# Patient Record
Sex: Male | Born: 1965
Health system: Southern US, Community
[De-identification: ages and names within clinical notes are randomized; demographics above are authoritative.]

## PROBLEM LIST (undated history)

## (undated) DIAGNOSIS — H5440 Blindness, one eye, unspecified eye: Secondary | ICD-10-CM

## (undated) DIAGNOSIS — I471 Supraventricular tachycardia: Secondary | ICD-10-CM

## (undated) DIAGNOSIS — I4891 Unspecified atrial fibrillation: Secondary | ICD-10-CM

## (undated) HISTORY — PX: EYE SURGERY: SHX253

## (undated) HISTORY — PX: RETINAL DETACHMENT SURGERY: SHX105

---
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
## 1999-08-06 ENCOUNTER — Encounter: Payer: Self-pay | Admitting: Emergency Medicine

## 1999-08-06 ENCOUNTER — Emergency Department (HOSPITAL_COMMUNITY): Admission: EM | Admit: 1999-08-06 | Discharge: 1999-08-06 | Payer: Self-pay | Admitting: Emergency Medicine

## 1999-08-09 ENCOUNTER — Encounter: Admission: RE | Admit: 1999-08-09 | Discharge: 1999-08-09 | Payer: Self-pay | Admitting: Family Medicine

## 1999-08-18 ENCOUNTER — Encounter: Payer: Self-pay | Admitting: Emergency Medicine

## 1999-08-18 ENCOUNTER — Emergency Department (HOSPITAL_COMMUNITY): Admission: EM | Admit: 1999-08-18 | Discharge: 1999-08-18 | Payer: Self-pay | Admitting: Emergency Medicine

## 1999-09-05 ENCOUNTER — Emergency Department (HOSPITAL_COMMUNITY): Admission: EM | Admit: 1999-09-05 | Discharge: 1999-09-05 | Payer: Self-pay | Admitting: Emergency Medicine

## 1999-09-11 ENCOUNTER — Encounter: Admission: RE | Admit: 1999-09-11 | Discharge: 1999-09-11 | Payer: Self-pay | Admitting: Family Medicine

## 1999-10-18 ENCOUNTER — Encounter: Admission: RE | Admit: 1999-10-18 | Discharge: 1999-10-18 | Payer: Self-pay | Admitting: Family Medicine

## 1999-11-01 ENCOUNTER — Encounter: Admission: RE | Admit: 1999-11-01 | Discharge: 1999-11-01 | Payer: Self-pay | Admitting: Family Medicine

## 1999-11-05 ENCOUNTER — Encounter: Admission: RE | Admit: 1999-11-05 | Discharge: 1999-11-05 | Payer: Self-pay | Admitting: Family Medicine

## 1999-11-08 ENCOUNTER — Ambulatory Visit (HOSPITAL_COMMUNITY): Admission: RE | Admit: 1999-11-08 | Discharge: 1999-11-08 | Payer: Self-pay | Admitting: Sports Medicine

## 1999-11-08 ENCOUNTER — Encounter: Payer: Self-pay | Admitting: Sports Medicine

## 1999-11-12 ENCOUNTER — Encounter: Admission: RE | Admit: 1999-11-12 | Discharge: 1999-11-12 | Payer: Self-pay | Admitting: Family Medicine

## 1999-11-13 ENCOUNTER — Encounter: Admission: RE | Admit: 1999-11-13 | Discharge: 1999-11-13 | Payer: Self-pay | Admitting: Family Medicine

## 1999-11-17 ENCOUNTER — Emergency Department (HOSPITAL_COMMUNITY): Admission: EM | Admit: 1999-11-17 | Discharge: 1999-11-17 | Payer: Self-pay | Admitting: Emergency Medicine

## 1999-11-21 ENCOUNTER — Encounter: Payer: Self-pay | Admitting: Neurology

## 1999-11-21 ENCOUNTER — Ambulatory Visit (HOSPITAL_COMMUNITY): Admission: RE | Admit: 1999-11-21 | Discharge: 1999-11-21 | Payer: Self-pay | Admitting: Neurology

## 1999-12-12 ENCOUNTER — Ambulatory Visit (HOSPITAL_COMMUNITY): Admission: RE | Admit: 1999-12-12 | Discharge: 1999-12-12 | Payer: Self-pay | Admitting: Neurology

## 2000-01-20 ENCOUNTER — Emergency Department (HOSPITAL_COMMUNITY): Admission: EM | Admit: 2000-01-20 | Discharge: 2000-01-20 | Payer: Self-pay | Admitting: *Deleted

## 2000-02-21 ENCOUNTER — Encounter: Admission: RE | Admit: 2000-02-21 | Discharge: 2000-02-21 | Payer: Self-pay | Admitting: Family Medicine

## 2000-03-05 ENCOUNTER — Encounter: Admission: RE | Admit: 2000-03-05 | Discharge: 2000-03-05 | Payer: Self-pay | Admitting: Family Medicine

## 2000-04-08 ENCOUNTER — Encounter: Admission: RE | Admit: 2000-04-08 | Discharge: 2000-04-08 | Payer: Self-pay | Admitting: Sports Medicine

## 2000-04-13 ENCOUNTER — Emergency Department (HOSPITAL_COMMUNITY): Admission: EM | Admit: 2000-04-13 | Discharge: 2000-04-13 | Payer: Self-pay | Admitting: Emergency Medicine

## 2000-06-17 ENCOUNTER — Emergency Department (HOSPITAL_COMMUNITY): Admission: EM | Admit: 2000-06-17 | Discharge: 2000-06-17 | Payer: Self-pay | Admitting: Emergency Medicine

## 2000-06-17 ENCOUNTER — Encounter: Payer: Self-pay | Admitting: Emergency Medicine

## 2000-12-17 ENCOUNTER — Encounter: Admission: RE | Admit: 2000-12-17 | Discharge: 2000-12-17 | Payer: Self-pay | Admitting: Family Medicine

## 2001-01-31 ENCOUNTER — Emergency Department (HOSPITAL_COMMUNITY): Admission: EM | Admit: 2001-01-31 | Discharge: 2001-01-31 | Payer: Self-pay | Admitting: Emergency Medicine

## 2001-02-02 ENCOUNTER — Encounter: Admission: RE | Admit: 2001-02-02 | Discharge: 2001-02-02 | Payer: Self-pay | Admitting: Family Medicine

## 2001-02-17 ENCOUNTER — Encounter: Admission: RE | Admit: 2001-02-17 | Discharge: 2001-02-17 | Payer: Self-pay | Admitting: Sports Medicine

## 2001-02-18 ENCOUNTER — Encounter: Admission: RE | Admit: 2001-02-18 | Discharge: 2001-02-18 | Payer: Self-pay | Admitting: Family Medicine

## 2001-05-12 ENCOUNTER — Encounter: Admission: RE | Admit: 2001-05-12 | Discharge: 2001-05-12 | Payer: Self-pay | Admitting: Family Medicine

## 2001-09-11 ENCOUNTER — Ambulatory Visit (HOSPITAL_COMMUNITY): Admission: EM | Admit: 2001-09-11 | Discharge: 2001-09-11 | Payer: Self-pay | Admitting: Emergency Medicine

## 2001-09-11 ENCOUNTER — Encounter: Payer: Self-pay | Admitting: Emergency Medicine

## 2001-09-18 ENCOUNTER — Encounter: Admission: RE | Admit: 2001-09-18 | Discharge: 2001-12-17 | Payer: Self-pay | Admitting: Orthopedic Surgery

## 2002-03-12 ENCOUNTER — Emergency Department (HOSPITAL_COMMUNITY): Admission: EM | Admit: 2002-03-12 | Discharge: 2002-03-12 | Payer: Self-pay | Admitting: Emergency Medicine

## 2002-04-02 ENCOUNTER — Ambulatory Visit (HOSPITAL_COMMUNITY): Admission: RE | Admit: 2002-04-02 | Discharge: 2002-04-02 | Payer: Self-pay | Admitting: Family Medicine

## 2002-04-02 ENCOUNTER — Encounter: Admission: RE | Admit: 2002-04-02 | Discharge: 2002-04-02 | Payer: Self-pay | Admitting: Family Medicine

## 2002-04-20 ENCOUNTER — Encounter: Admission: RE | Admit: 2002-04-20 | Discharge: 2002-04-20 | Payer: Self-pay | Admitting: Sports Medicine

## 2002-04-22 ENCOUNTER — Encounter: Admission: RE | Admit: 2002-04-22 | Discharge: 2002-04-22 | Payer: Self-pay | Admitting: Family Medicine

## 2003-08-22 ENCOUNTER — Encounter: Admission: RE | Admit: 2003-08-22 | Discharge: 2003-08-22 | Payer: Self-pay | Admitting: Family Medicine

## 2003-09-06 ENCOUNTER — Encounter: Admission: RE | Admit: 2003-09-06 | Discharge: 2003-09-06 | Payer: Self-pay | Admitting: Family Medicine

## 2004-01-26 ENCOUNTER — Emergency Department (HOSPITAL_COMMUNITY): Admission: EM | Admit: 2004-01-26 | Discharge: 2004-01-27 | Payer: Self-pay | Admitting: Emergency Medicine

## 2006-09-18 ENCOUNTER — Emergency Department (HOSPITAL_COMMUNITY): Admission: EM | Admit: 2006-09-18 | Discharge: 2006-09-18 | Payer: Self-pay | Admitting: Emergency Medicine

## 2006-09-20 ENCOUNTER — Emergency Department (HOSPITAL_COMMUNITY): Admission: EM | Admit: 2006-09-20 | Discharge: 2006-09-20 | Payer: Self-pay | Admitting: Emergency Medicine

## 2006-12-04 ENCOUNTER — Emergency Department (HOSPITAL_COMMUNITY): Admission: EM | Admit: 2006-12-04 | Discharge: 2006-12-05 | Payer: Self-pay | Admitting: Emergency Medicine

## 2006-12-25 DIAGNOSIS — F3289 Other specified depressive episodes: Secondary | ICD-10-CM | POA: Insufficient documentation

## 2006-12-25 DIAGNOSIS — F1420 Cocaine dependence, uncomplicated: Secondary | ICD-10-CM | POA: Insufficient documentation

## 2006-12-25 DIAGNOSIS — F1010 Alcohol abuse, uncomplicated: Secondary | ICD-10-CM | POA: Insufficient documentation

## 2006-12-25 DIAGNOSIS — F329 Major depressive disorder, single episode, unspecified: Secondary | ICD-10-CM

## 2006-12-25 DIAGNOSIS — F17210 Nicotine dependence, cigarettes, uncomplicated: Secondary | ICD-10-CM

## 2007-02-14 ENCOUNTER — Emergency Department (HOSPITAL_COMMUNITY): Admission: EM | Admit: 2007-02-14 | Discharge: 2007-02-14 | Payer: Self-pay | Admitting: Emergency Medicine

## 2007-06-21 ENCOUNTER — Inpatient Hospital Stay (HOSPITAL_COMMUNITY): Admission: EM | Admit: 2007-06-21 | Discharge: 2007-06-24 | Payer: Self-pay | Admitting: *Deleted

## 2007-06-25 ENCOUNTER — Ambulatory Visit: Payer: Self-pay | Admitting: *Deleted

## 2008-03-25 ENCOUNTER — Emergency Department (HOSPITAL_COMMUNITY): Admission: EM | Admit: 2008-03-25 | Discharge: 2008-03-25 | Payer: Self-pay | Admitting: Emergency Medicine

## 2008-05-21 ENCOUNTER — Emergency Department (HOSPITAL_COMMUNITY): Admission: EM | Admit: 2008-05-21 | Discharge: 2008-05-21 | Payer: Self-pay | Admitting: Emergency Medicine

## 2009-05-25 ENCOUNTER — Emergency Department (HOSPITAL_COMMUNITY): Admission: EM | Admit: 2009-05-25 | Discharge: 2009-05-26 | Payer: Self-pay | Admitting: Pediatrics

## 2009-08-07 ENCOUNTER — Emergency Department (HOSPITAL_COMMUNITY): Admission: EM | Admit: 2009-08-07 | Discharge: 2009-08-07 | Payer: Self-pay | Admitting: Family Medicine

## 2009-08-13 ENCOUNTER — Emergency Department (HOSPITAL_COMMUNITY): Admission: EM | Admit: 2009-08-13 | Discharge: 2009-08-13 | Payer: Self-pay | Admitting: Emergency Medicine

## 2010-06-23 ENCOUNTER — Emergency Department (HOSPITAL_COMMUNITY): Admission: EM | Admit: 2010-06-23 | Discharge: 2010-06-23 | Payer: Self-pay | Admitting: Emergency Medicine

## 2010-07-17 ENCOUNTER — Emergency Department (HOSPITAL_COMMUNITY): Admission: EM | Admit: 2010-07-17 | Discharge: 2010-07-17 | Payer: Self-pay | Admitting: Emergency Medicine

## 2010-08-12 ENCOUNTER — Emergency Department (HOSPITAL_COMMUNITY): Admission: EM | Admit: 2010-08-12 | Discharge: 2010-08-12 | Payer: Self-pay | Admitting: Emergency Medicine

## 2010-08-14 ENCOUNTER — Emergency Department (HOSPITAL_COMMUNITY): Admission: EM | Admit: 2010-08-14 | Discharge: 2010-08-14 | Payer: Self-pay | Admitting: Emergency Medicine

## 2010-08-16 ENCOUNTER — Emergency Department (HOSPITAL_COMMUNITY): Admission: EM | Admit: 2010-08-16 | Discharge: 2010-08-16 | Payer: Self-pay | Admitting: Emergency Medicine

## 2010-11-09 ENCOUNTER — Emergency Department (HOSPITAL_COMMUNITY)
Admission: EM | Admit: 2010-11-09 | Discharge: 2010-11-09 | Disposition: A | Discharge status: Other Acute Inpatient Hospital | Payer: Self-pay | Source: Home / Self Care | Admitting: Family Medicine

## 2010-11-09 ENCOUNTER — Emergency Department (HOSPITAL_COMMUNITY)
Admission: EM | Admit: 2010-11-09 | Discharge: 2010-11-09 | Payer: Self-pay | Source: Home / Self Care | Admitting: Emergency Medicine

## 2010-11-27 ENCOUNTER — Emergency Department (HOSPITAL_COMMUNITY)
Admission: EM | Admit: 2010-11-27 | Discharge: 2010-11-27 | Payer: Self-pay | Source: Home / Self Care | Admitting: Emergency Medicine

## 2010-12-12 ENCOUNTER — Other Ambulatory Visit: Payer: Self-pay | Admitting: Family Medicine

## 2010-12-12 ENCOUNTER — Ambulatory Visit
Admission: RE | Admit: 2010-12-12 | Discharge: 2010-12-12 | Disposition: A | Discharge status: Home / Self Care | Payer: BC Managed Care – PPO | Source: Ambulatory Visit | Attending: Family Medicine | Admitting: Family Medicine

## 2010-12-12 DIAGNOSIS — H539 Unspecified visual disturbance: Secondary | ICD-10-CM

## 2010-12-12 MED ORDER — GADOBENATE DIMEGLUMINE 529 MG/ML IV SOLN: 18.0000 mL | Freq: Once | INTRAVENOUS | Status: AC | PRN

## 2011-01-08 ENCOUNTER — Emergency Department (HOSPITAL_COMMUNITY)
Admission: EM | Admit: 2011-01-08 | Discharge: 2011-01-08 | Disposition: A | Discharge status: Home / Self Care | Payer: BC Managed Care – PPO | Attending: Emergency Medicine | Admitting: Emergency Medicine

## 2011-01-08 DIAGNOSIS — G40909 Epilepsy, unspecified, not intractable, without status epilepticus: Secondary | ICD-10-CM | POA: Insufficient documentation

## 2011-01-08 DIAGNOSIS — Z79899 Other long term (current) drug therapy: Secondary | ICD-10-CM | POA: Insufficient documentation

## 2011-01-08 DIAGNOSIS — L02419 Cutaneous abscess of limb, unspecified: Secondary | ICD-10-CM | POA: Insufficient documentation

## 2011-01-08 DIAGNOSIS — L738 Other specified follicular disorders: Secondary | ICD-10-CM | POA: Insufficient documentation

## 2011-01-09 LAB — CULTURE, ROUTINE-ABSCESS

## 2011-01-31 LAB — CBC
MCHC: 33.5 g/dL (ref 30.0–36.0)
MCV: 83.2 fL (ref 78.0–100.0)
RBC: 5.33 MIL/uL (ref 4.22–5.81)
RDW: 14.2 % (ref 11.5–15.5)

## 2011-01-31 LAB — BASIC METABOLIC PANEL
BUN: 11 mg/dL (ref 6–23)
CO2: 28 mEq/L (ref 19–32)
Chloride: 100 mEq/L (ref 96–112)
Creatinine, Ser: 1 mg/dL (ref 0.4–1.5)
GFR calc Af Amer: >60 mL/min (ref >60)
Glucose, Bld: 110 mg/dL — ABNORMAL HIGH (ref 70–99)

## 2011-01-31 LAB — DIFFERENTIAL
Basophils Relative: 0 % (ref 0–1)
Eosinophils Absolute: 0.2 10*3/uL (ref 0.0–0.7)
Monocytes Relative: 11 % (ref 3–12)
Neutrophils Relative %: 56 % (ref 43–77)
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
## 2011-03-15 NOTE — Op Note (Signed)
[facility]  Patient:    MELTON, WALLS Visit Number: [id number] MRN: [id number]          Service Type: EMS Location: ED Attending Physician:  Tobey Bride Dictated by:   Katy Fitch Naaman Plummer., M.D. Proc. Date: 09/11/01 Admit Date:  09/11/2001                             Operative Report  PREOPERATIVE DIAGNOSIS:  Status post accidental chain saw laceration, dorsal radial aspect of left hand extending from the metacarpophalangeal joint across the first dorsal interosseous and first web space over the dorsal aspect of the index metacarpal, with obvious open fracture/excavation injury of index metacarpal and untidy laceration of first dorsal interosseous muscle and ulnar deviation of the posterior finger.  POSTOPERATIVE DIAGNOSIS:  Status post accidental chain saw laceration, dorsal radial aspect of left hand extending from the metacarpophalangeal joint across the first dorsal interosseous and first web space over the dorsal aspect of the index metacarpal, with obvious open fracture/excavation injury of index metacarpal and untidy laceration of first dorsal interosseous muscle and ulnar deviation of the posterior finger.  Identification of laceration/avulsion of one of the dorsal radial sensory branches to the radial aspect of the index finger and a very untidy injury to the first dorsal interosseous muscle.  OPERATIONS: 1. Irrigation and debridement of open excavation fracture of left index    metacarpal neck region. 2. Repair of lacerated first dorsal interosseous intrinsic muscle, radial    aspect of index finger. 3. Layered closure of 8 cm laceration of left index finger hand, dorsal    aspect.  OPERATING SURGEON:  Katy Fitch. Sypher, Montez Hageman., M.D.  ASSISTANT:  Jonni Sanger, P.A.  ANESTHESIA:  0.25% Marcaine and 1% lidocaine field block of left hand and block of radial sensory branches at the wrist level.  SUPERVISING ANESTHESIOLOGIST:   Lucille Passy, M.D.  INDICATIONS:  The patient is a 45 year old gentleman who is "disabled."  He has a history of depression and is currently taking Zoloft and Remeron.  He was using a chain saw earlier today cutting up branches, using the chain saw in his right hand with only one hand controlling the saw and trying to hold wood with the left hand.  He had a "kick back" injury and sustained a deep laceration to the dorsal aspect of the left hand just proximal to the metacarpophalangeal joint.  He has obvious laceration to the level of the bone with very untidy muscle injury.  He had venous bleeding.  He was brought to the [facility] Emergency Room, where he was evaluated by Dr. Beverely Pace, the attending emergency room physician.  Dr. Beverely Pace dressed his wound, started an IV, gave him Ancef and checked his tetanus status, which was noted to be up to date.  He had had a booster within five years.  A hand surgery consult was requested.  Screening labs were obtained and arrangements were made for transfer to the operating room for irrigation and debridement of his open fracture, followed by repair of his muscle injuries and repair of any nerve and tendon injuries we identified, if possible.  DESCRIPTION OF PROCEDURE:  The patient was brought to the operating room and placed in the supine position on the operating table.  Following light sedation, the left arm was prepped with Betadine soap and solution and sterilely draped.  Lidocaine 1% and 0.25% Marcaine, both with epinephrine, were  infiltrated along the wound margins and across the dorsal radial aspect of the wrist to obtain a block of the radial superficial sensory branches.  When anesthesia was satisfactory, the arm was exsanguinated and an arterial tourniquet on the proximal left brachium inflated to 220 mmHg.  The wound was copiously irrigated with sterile saline followed by meticulous debridement of the periosteum,  muscle, subcutaneous fat and skin margins.  All visible foreign material was removed.  There was both vegetable-type material and what appeared to be some gritty dirt-type material.  The wound was then copiously irrigated with triple-antibiotic solution, followed by closure of the muscle in layers with mattress sutures of 3-0 Vicryl and repair of the tendon with two grasping sutures of 3-0 Vicryl.  This corrected the ulnar deviation deformity of the index MP joint.  The skin was then repaired with layered closure with subdermal sutures of 3-0 Vicryl and intradermal 3-0 Prolene.  The length of the repair was approximately 8 cm.  The wound was then dressed with Xeroform sterile gauze and a volar splint, maintaining the wrist in 20 degrees of dorsiflexion, the index in 39 degrees of MP flexion, IP extension and radial deviation of the MP joint to relax the first dorsal interosseous muscle repair.  The patient was awakened from sedation and transferred to the recovery room with stable vital signs.  We advised him to elevate his hand for the next week and have given him prescriptions for Percocet 5 mg, 1-2 tablets p.o. q.4-6h. p.r.n. pain.  Also, Keflex 500 mg, one p.o. q.8h. x 4 days as a prophylactic antibiotic. Dictated by:   Katy Fitch Naaman Plummer., M.D. Attending Physician:  Tobey Bride DD:  09/11/01 TD:  09/11/01 Job: [id number]
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
## 2011-07-24 LAB — CULTURE, ROUTINE-ABSCESS

## 2011-08-09 LAB — DRUGS OF ABUSE SCREEN W/O ALC, ROUTINE URINE
Amphetamine Screen, Ur: NEGATIVE
Marijuana Metabolite: NEGATIVE
Propoxyphene: NEGATIVE

## 2011-08-09 LAB — COMPREHENSIVE METABOLIC PANEL
ALT: 12
AST: 17
CO2: 27
Calcium: 9.4
Chloride: 100
GFR calc Af Amer: >60
GFR calc non Af Amer: >60
Sodium: 137
Total Bilirubin: 0.8

## 2011-08-09 LAB — URINALYSIS, ROUTINE W REFLEX MICROSCOPIC
Nitrite: NEGATIVE
Specific Gravity, Urine: 1.02
pH: 7.5

## 2011-08-09 LAB — HEPATIC FUNCTION PANEL
ALT: 11
Bilirubin, Direct: 0.1
Indirect Bilirubin: 0.7
Total Bilirubin: 0.8

## 2011-08-09 LAB — DIFFERENTIAL
Eosinophils Absolute: 0.2
Eosinophils Relative: 2
Lymphs Abs: 2.1
Monocytes Absolute: 1 — ABNORMAL HIGH

## 2011-08-09 LAB — TSH: TSH: 2.409

## 2011-08-09 LAB — CBC
RBC: 5.55
WBC: 10.3

## 2013-05-11 ENCOUNTER — Other Ambulatory Visit (HOSPITAL_COMMUNITY): Payer: Self-pay | Admitting: Family Medicine

## 2013-05-11 DIAGNOSIS — R079 Chest pain, unspecified: Secondary | ICD-10-CM

## 2013-05-14 ENCOUNTER — Ambulatory Visit (HOSPITAL_COMMUNITY)
Admission: RE | Admit: 2013-05-14 | Discharge: 2013-05-14 | Disposition: A | Discharge status: Home / Self Care | Payer: Commercial Managed Care - PPO | Source: Ambulatory Visit | Attending: Family Medicine | Admitting: Family Medicine

## 2013-05-14 DIAGNOSIS — R071 Chest pain on breathing: Secondary | ICD-10-CM | POA: Insufficient documentation

## 2013-05-14 DIAGNOSIS — J438 Other emphysema: Secondary | ICD-10-CM | POA: Insufficient documentation

## 2013-05-14 DIAGNOSIS — I2510 Atherosclerotic heart disease of native coronary artery without angina pectoris: Secondary | ICD-10-CM | POA: Insufficient documentation

## 2013-05-14 DIAGNOSIS — R079 Chest pain, unspecified: Secondary | ICD-10-CM

## 2013-05-14 DIAGNOSIS — F17200 Nicotine dependence, unspecified, uncomplicated: Secondary | ICD-10-CM | POA: Insufficient documentation

## 2016-06-03 ENCOUNTER — Encounter (HOSPITAL_COMMUNITY): Payer: Self-pay | Admitting: Emergency Medicine

## 2016-06-03 ENCOUNTER — Ambulatory Visit (HOSPITAL_COMMUNITY)
Admission: EM | Admit: 2016-06-03 | Discharge: 2016-06-03 | Disposition: A | Discharge status: Home / Self Care | Payer: Commercial Managed Care - PPO | Attending: Family Medicine | Admitting: Family Medicine

## 2016-06-03 DIAGNOSIS — T1592XA Foreign body on external eye, part unspecified, left eye, initial encounter: Secondary | ICD-10-CM | POA: Diagnosis not present

## 2016-06-03 HISTORY — DX: Blindness, one eye, unspecified eye: H54.40

## 2016-06-03 MED ORDER — EYE WASH OPHTH SOLN
OPHTHALMIC | Status: AC
  Filled 2016-06-03: qty 118

## 2016-06-03 MED ORDER — FLUORESCEIN SODIUM 1 MG OP STRP
ORAL_STRIP | OPHTHALMIC | Status: AC
  Filled 2016-06-03: qty 2

## 2016-06-03 MED ORDER — TETRACAINE HCL 0.5 % OP SOLN
OPHTHALMIC | Status: AC
  Filled 2016-06-03: qty 2

## 2016-06-03 NOTE — ED Notes (Signed)
Eye box at bedside 
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
## 2016-06-06 ENCOUNTER — Ambulatory Visit (HOSPITAL_COMMUNITY)
Admission: EM | Admit: 2016-06-06 | Discharge: 2016-06-06 | Disposition: A | Discharge status: Home / Self Care | Payer: Commercial Managed Care - PPO | Attending: Family Medicine | Admitting: Family Medicine

## 2016-06-06 ENCOUNTER — Encounter (HOSPITAL_COMMUNITY): Payer: Self-pay | Admitting: Emergency Medicine

## 2016-06-06 DIAGNOSIS — H578 Other specified disorders of eye and adnexa: Secondary | ICD-10-CM | POA: Diagnosis not present

## 2016-06-06 DIAGNOSIS — H109 Unspecified conjunctivitis: Secondary | ICD-10-CM

## 2016-06-06 DIAGNOSIS — H5789 Other specified disorders of eye and adnexa: Secondary | ICD-10-CM

## 2016-06-06 MED ORDER — KETOTIFEN FUMARATE 0.025 % OP SOLN: 1.0000 [drp] | Freq: Two times a day (BID) | OPHTHALMIC | 0 refills | Status: DC

## 2016-06-06 MED ORDER — POLYMYXIN B-TRIMETHOPRIM 10000-0.1 UNIT/ML-% OP SOLN: 1.0000 [drp] | OPHTHALMIC | 0 refills | Status: DC

## 2016-06-06 NOTE — ED Provider Notes (Signed)
CSN: [id number]     Arrival date & time 06/06/16  1009 History   First MD Initiated Contact with Patient 06/06/16 1107     Chief Complaint  Patient presents with  . Eye Pain   (Consider location/radiation/quality/duration/timing/severity/associated sxs/prior Treatment) 50 year old male presents to the urgent care with pain and swelling and redness and irritation to the left eye. He states he was seen in the urgent care proximal and one week ago and received a liter of normal saline for irrigation of the left eye via Morgan lens. The initial onset of symptoms occurred while he was working on a job in which there were fine particular matter in the environment and believes this injured his eye. He started having some eye redness and irritation and watering which prompted his visit to the urgent care. He states he received no additional medications are eyedrops. He has been relatively comfortable and did feel much better after the eye irrigation. In the past couple of days he has noticed an increase in erythema, some exudate and matting of fluid around the eyelids, irritation and discomfort.  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      Past Medical History:  Diagnosis Date  . 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 Detached retina    History reviewed. No pertinent surgical history. History reviewed. No pertinent family history. Social History  Substance Use Topics  . Smoking status: Current Every Day Smoker    Packs/day: 1.50    Types: Cigarettes  . Smokeless tobacco: Current User  . Alcohol use No    Review of Systems  Constitutional: Negative.   HENT: Negative.   Eyes: Positive for pain, discharge, redness and itching.  Respiratory: Negative.   Skin: Negative.   Neurological: Negative.   All other systems reviewed and are negative.   Allergies  Review of patient's allergies  indicates no known allergies.  Home Medications   Prior to Admission medications   Medication Sig Start Date End Date Taking? Authorizing Provider  aspirin 81 MG tablet Take 81 mg by mouth daily.    Historical Provider, MD  ketotifen (ZADITOR) 0.025 % ophthalmic solution Place 1 drop into the left eye 2 (two) times daily. 06/06/16   Hayden Rasmussenavid Reilley Latorre, NP  traMADol (ULTRAM) 50 MG tablet Take by mouth every 6 (six) hours as needed.    Historical Provider, MD  trimethoprim-polymyxin b (POLYTRIM) ophthalmic solution Place 1 drop into the left eye every 4 (four) hours. 06/06/16   Hayden Rasmussenavid Analilia Geddis, NP   Meds Ordered and Administered this Visit  Medications - No data to display  BP 138/84 (BP Location: Left Arm)   Pulse 69   Temp 98 F (36.7 C) (Oral)   Resp 16   SpO2 97%  No data found.   Physical Exam  Constitutional: He is oriented to person, place, and time. He appears well-developed and well-nourished. No distress.  HENT:  Head: Normocephalic and atraumatic.  Eyes:  Left eye with normal EOM. The sclera is moderately injected and erythematous. The upper and lower conjunctiva with erythema and swelling. Small amount of clear watery drainage. No exudate or purulent material seen. The anterior chamber and iris has been destroyed from previous injury. No pupillary response.  Neck: Normal range of motion. Neck supple.  Cardiovascular: Normal rate.   Pulmonary/Chest: Effort normal.  Musculoskeletal: Normal range of motion.  Neurological: He is alert and oriented to person, place, and time.  Skin: Skin is warm and dry.  Psychiatric: He has a normal mood and affect.  Nursing note and vitals reviewed.   Urgent Care Course   Clinical Course    Procedures (including critical care time)  Labs Review Labs Reviewed - No data to display  Imaging Review No results found.   Visual Acuity Review  Right Eye Distance: (S) 20/40 w/o corrections Left Eye Distance: (S) hx retinal detachment Bilateral  Distance:    Right Eye Near:   Left Eye Near:    Bilateral Near:         MDM   1. Conjunctivitis of left eye   2. Eye irritation    Meds ordered this encounter  Medications  . trimethoprim-polymyxin b (POLYTRIM) ophthalmic solution    Sig: Place 1 drop into the left eye every 4 (four) hours.    Dispense:  10 mL    Refill:  0    Order Specific Question:   Supervising Provider    Answer:   HONIG, Charm [id number]  . ketotifen (ZADITOR) 0.025 % ophthalmic solution    Sig: Place 1 drop into the left eye 2 (two) times daily.    Dispense:  5 mL    Refill:  0    Order Specific Question:   Supervising Provider    Answer:   Charm Rings [[id number]]  F/U with opthal as directed. P.1     Hayden Rasmussen, NP 06/06/16 1131

## 2016-06-06 NOTE — ED Triage Notes (Signed)
C/o left eye pain/irriation onset x1 week... 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 Hx of retinal detachment at age 536-50 y/o and blindness of that eye.... Sx today include redness, pain and watery.... A&O x4... NAD

## 2016-12-02 ENCOUNTER — Ambulatory Visit (INDEPENDENT_AMBULATORY_CARE_PROVIDER_SITE_OTHER): Payer: Self-pay | Admitting: Family Medicine

## 2016-12-02 DIAGNOSIS — Z0000 Encounter for general adult medical examination without abnormal findings: Secondary | ICD-10-CM

## 2016-12-04 LAB — TB SKIN TEST
Induration: 0 mm
TB Skin Test: NEGATIVE

## 2017-08-12 ENCOUNTER — Emergency Department
Admission: EM | Admit: 2017-08-12 | Discharge: 2017-08-12 | Disposition: A | Discharge status: Home / Self Care | Payer: Commercial Managed Care - PPO | Attending: Emergency Medicine | Admitting: Emergency Medicine

## 2017-08-12 ENCOUNTER — Emergency Department: Payer: Commercial Managed Care - PPO

## 2017-08-12 ENCOUNTER — Encounter: Payer: Self-pay | Admitting: Emergency Medicine

## 2017-08-12 DIAGNOSIS — R42 Dizziness and giddiness: Secondary | ICD-10-CM | POA: Insufficient documentation

## 2017-08-12 DIAGNOSIS — Z5321 Procedure and treatment not carried out due to patient leaving prior to being seen by health care provider: Secondary | ICD-10-CM | POA: Insufficient documentation

## 2017-08-12 LAB — COMPREHENSIVE METABOLIC PANEL
ALBUMIN: 4 g/dL (ref 3.5–5.0)
ALT: 71 U/L — ABNORMAL HIGH (ref 17–63)
ANION GAP: 9 (ref 5–15)
AST: 52 U/L — ABNORMAL HIGH (ref 15–41)
Alkaline Phosphatase: 53 U/L (ref 38–126)
BILIRUBIN TOTAL: 0.5 mg/dL (ref 0.3–1.2)
BUN: 14 mg/dL (ref 6–20)
CO2: 26 mmol/L (ref 22–32)
Calcium: 9.1 mg/dL (ref 8.9–10.3)
Chloride: 102 mmol/L (ref 101–111)
Creatinine, Ser: 1 mg/dL (ref 0.61–1.24)
GFR calc Af Amer: >60 mL/min (ref >60)
GFR calc non Af Amer: >60 mL/min (ref >60)
GLUCOSE: 145 mg/dL — AB (ref 65–99)
POTASSIUM: 4 mmol/L (ref 3.5–5.1)
SODIUM: 137 mmol/L (ref 135–145)
TOTAL PROTEIN: 8 g/dL (ref 6.5–8.1)

## 2017-08-12 LAB — TROPONIN I: Troponin I: <0.03 ng/mL (ref <0.03)

## 2017-08-12 LAB — DIFFERENTIAL
BASOS PCT: 1 %
Basophils Absolute: 0.1 10*3/uL (ref 0–0.1)
EOS ABS: 0.2 10*3/uL (ref 0–0.7)
EOS PCT: 2 %
Lymphocytes Relative: 25 %
Lymphs Abs: 2.2 10*3/uL (ref 1.0–3.6)
Monocytes Absolute: 0.9 10*3/uL (ref 0.2–1.0)
Monocytes Relative: 10 %
NEUTROS PCT: 62 %
Neutro Abs: 5.7 10*3/uL (ref 1.4–6.5)

## 2017-08-12 LAB — CBC
HCT: 46.7 % (ref 40.0–52.0)
Hemoglobin: 15.5 g/dL (ref 13.0–18.0)
MCH: 27.9 pg (ref 26.0–34.0)
MCHC: 33.2 g/dL (ref 32.0–36.0)
MCV: 84.1 fL (ref 80.0–100.0)
PLATELETS: 213 10*3/uL (ref 150–440)
RBC: 5.55 MIL/uL (ref 4.40–5.90)
RDW: 13.9 % (ref 11.5–14.5)
WBC: 9.1 10*3/uL (ref 3.8–10.6)

## 2017-08-12 LAB — PROTIME-INR
INR: 0.94
PROTHROMBIN TIME: 12.5 s (ref 11.4–15.2)

## 2017-08-12 LAB — APTT: aPTT: 28 seconds (ref 24–36)

## 2017-08-12 NOTE — ED Triage Notes (Signed)
Pt to ED c/o dizziness that started this morning at 0630, woke up fine around 0515.  Denies pain or accompanying symptoms.

## 2017-08-13 ENCOUNTER — Telehealth: Payer: Self-pay | Admitting: Emergency Medicine

## 2017-08-13 NOTE — Telephone Encounter (Signed)
Called patient due to lwot to inquire about condition and follow up plans. Pt says he had to leave as he has a sick family member.  Says he feels okay now.  I explained that it would be best for him to inform his pcp about his symptoms and have them review the diagnostic tests done here.  He agrees and will return or seek care if symptoms return.

## 2018-06-02 ENCOUNTER — Observation Stay (HOSPITAL_COMMUNITY)
Admission: EM | Admit: 2018-06-02 | Discharge: 2018-06-04 | Disposition: A | Discharge status: Home / Self Care | Payer: Self-pay | Attending: Internal Medicine | Admitting: Internal Medicine

## 2018-06-02 ENCOUNTER — Ambulatory Visit (INDEPENDENT_AMBULATORY_CARE_PROVIDER_SITE_OTHER): Payer: Self-pay

## 2018-06-02 ENCOUNTER — Encounter (HOSPITAL_COMMUNITY): Payer: Self-pay

## 2018-06-02 ENCOUNTER — Other Ambulatory Visit: Payer: Self-pay

## 2018-06-02 ENCOUNTER — Ambulatory Visit (HOSPITAL_COMMUNITY)
Admission: EM | Admit: 2018-06-02 | Discharge: 2018-06-02 | Disposition: A | Discharge status: Home / Self Care | Payer: Commercial Managed Care - PPO | Attending: Family Medicine | Admitting: Family Medicine

## 2018-06-02 DIAGNOSIS — Z8249 Family history of ischemic heart disease and other diseases of the circulatory system: Secondary | ICD-10-CM | POA: Insufficient documentation

## 2018-06-02 DIAGNOSIS — F17210 Nicotine dependence, cigarettes, uncomplicated: Secondary | ICD-10-CM | POA: Insufficient documentation

## 2018-06-02 DIAGNOSIS — I4719 Other supraventricular tachycardia: Secondary | ICD-10-CM

## 2018-06-02 DIAGNOSIS — K920 Hematemesis: Secondary | ICD-10-CM

## 2018-06-02 DIAGNOSIS — R05 Cough: Secondary | ICD-10-CM | POA: Insufficient documentation

## 2018-06-02 DIAGNOSIS — J209 Acute bronchitis, unspecified: Secondary | ICD-10-CM | POA: Insufficient documentation

## 2018-06-02 DIAGNOSIS — I471 Supraventricular tachycardia: Secondary | ICD-10-CM | POA: Insufficient documentation

## 2018-06-02 DIAGNOSIS — R112 Nausea with vomiting, unspecified: Secondary | ICD-10-CM | POA: Insufficient documentation

## 2018-06-02 DIAGNOSIS — Z9889 Other specified postprocedural states: Secondary | ICD-10-CM | POA: Insufficient documentation

## 2018-06-02 DIAGNOSIS — I4891 Unspecified atrial fibrillation: Principal | ICD-10-CM

## 2018-06-02 DIAGNOSIS — R059 Cough, unspecified: Secondary | ICD-10-CM

## 2018-06-02 DIAGNOSIS — R9431 Abnormal electrocardiogram [ECG] [EKG]: Secondary | ICD-10-CM | POA: Insufficient documentation

## 2018-06-02 DIAGNOSIS — H5462 Unqualified visual loss, left eye, normal vision right eye: Secondary | ICD-10-CM | POA: Insufficient documentation

## 2018-06-02 DIAGNOSIS — R51 Headache: Secondary | ICD-10-CM | POA: Insufficient documentation

## 2018-06-02 DIAGNOSIS — E7219 Other disorders of sulfur-bearing amino-acid metabolism: Secondary | ICD-10-CM | POA: Diagnosis present

## 2018-06-02 DIAGNOSIS — J40 Bronchitis, not specified as acute or chronic: Secondary | ICD-10-CM

## 2018-06-02 DIAGNOSIS — R509 Fever, unspecified: Secondary | ICD-10-CM | POA: Insufficient documentation

## 2018-06-02 DIAGNOSIS — R062 Wheezing: Secondary | ICD-10-CM

## 2018-06-02 HISTORY — DX: Unspecified atrial fibrillation: I48.91

## 2018-06-02 HISTORY — DX: Supraventricular tachycardia: I47.1

## 2018-06-02 HISTORY — DX: Other supraventricular tachycardia: I47.19

## 2018-06-02 LAB — CBC WITH DIFFERENTIAL/PLATELET
Abs Immature Granulocytes: 0.1 10*3/uL (ref 0.0–0.1)
Basophils Absolute: 0.1 10*3/uL (ref 0.0–0.1)
Basophils Relative: 1 %
EOS PCT: 2 %
Eosinophils Absolute: 0.2 10*3/uL (ref 0.0–0.7)
HEMATOCRIT: 51.3 % (ref 39.0–52.0)
Hemoglobin: 16 g/dL (ref 13.0–17.0)
IMMATURE GRANULOCYTES: 1 %
LYMPHS ABS: 2.4 10*3/uL (ref 0.7–4.0)
Lymphocytes Relative: 18 %
MCH: 27.3 pg (ref 26.0–34.0)
MCHC: 31.2 g/dL (ref 30.0–36.0)
MCV: 87.4 fL (ref 78.0–100.0)
MONO ABS: 1.7 10*3/uL — AB (ref 0.1–1.0)
MONOS PCT: 12 %
Neutro Abs: 9 10*3/uL — ABNORMAL HIGH (ref 1.7–7.7)
Neutrophils Relative %: 66 %
Platelets: 186 10*3/uL (ref 150–400)
RBC: 5.87 MIL/uL — ABNORMAL HIGH (ref 4.22–5.81)
RDW: 14.6 % (ref 11.5–15.5)
WBC: 13.4 10*3/uL — ABNORMAL HIGH (ref 4.0–10.5)

## 2018-06-02 LAB — I-STAT TROPONIN, ED
TROPONIN I, POC: 0 ng/mL (ref 0.00–0.08)
Troponin i, poc: 0.01 ng/mL (ref 0.00–0.08)

## 2018-06-02 LAB — BASIC METABOLIC PANEL
Anion gap: 9 (ref 5–15)
BUN: 11 mg/dL (ref 6–20)
CALCIUM: 9.2 mg/dL (ref 8.9–10.3)
CO2: 26 mmol/L (ref 22–32)
Chloride: 103 mmol/L (ref 98–111)
Creatinine, Ser: 0.83 mg/dL (ref 0.61–1.24)
GFR calc Af Amer: >60 mL/min (ref >60)
GFR calc non Af Amer: >60 mL/min (ref >60)
GLUCOSE: 77 mg/dL (ref 70–99)
Potassium: 4.3 mmol/L (ref 3.5–5.1)
Sodium: 138 mmol/L (ref 135–145)

## 2018-06-02 LAB — HEPATIC FUNCTION PANEL
ALK PHOS: 63 U/L (ref 38–126)
ALT: 87 U/L — AB (ref 0–44)
AST: 54 U/L — ABNORMAL HIGH (ref 15–41)
Albumin: 4 g/dL (ref 3.5–5.0)
Bilirubin, Direct: 0.1 mg/dL (ref 0.0–0.2)
Indirect Bilirubin: 0.4 mg/dL (ref 0.3–0.9)
TOTAL PROTEIN: 9 g/dL — AB (ref 6.5–8.1)
Total Bilirubin: 0.5 mg/dL (ref 0.3–1.2)

## 2018-06-02 LAB — LIPASE, BLOOD: LIPASE: 38 U/L (ref 11–51)

## 2018-06-02 LAB — MAGNESIUM: Magnesium: 1.9 mg/dL (ref 1.7–2.4)

## 2018-06-02 MED ORDER — DOXYCYCLINE HYCLATE 100 MG PO TABS
100.0000 mg | ORAL_TABLET | Freq: Once | ORAL | Status: AC
Start: 2018-06-02 — End: 2018-06-02
  Administered 2018-06-02: 100 mg via ORAL
  Filled 2018-06-02: qty 1

## 2018-06-02 MED ORDER — IPRATROPIUM-ALBUTEROL 0.5-2.5 (3) MG/3ML IN SOLN
3.0000 mL | Freq: Once | RESPIRATORY_TRACT | Status: AC
  Administered 2018-06-02: 3 mL via RESPIRATORY_TRACT
  Filled 2018-06-02: qty 3

## 2018-06-02 MED ORDER — DIPHENHYDRAMINE HCL 25 MG PO CAPS
25.0000 mg | ORAL_CAPSULE | Freq: Once | ORAL | Status: AC
  Administered 2018-06-02: 25 mg via ORAL
  Filled 2018-06-02: qty 1

## 2018-06-02 MED ORDER — METOCLOPRAMIDE HCL 5 MG/ML IJ SOLN
10.0000 mg | Freq: Once | INTRAMUSCULAR | Status: AC
  Administered 2018-06-02: 10 mg via INTRAVENOUS
  Filled 2018-06-02: qty 2

## 2018-06-02 MED ORDER — PREDNISONE 20 MG PO TABS
60.0000 mg | ORAL_TABLET | Freq: Once | ORAL | Status: AC
  Administered 2018-06-02: 60 mg via ORAL
  Filled 2018-06-02: qty 3

## 2018-06-02 NOTE — ED Provider Notes (Signed)
[facility]   CSN: [id number] Arrival date & time: 06/02/18  1723  History   Chief Complaint Chief Complaint  Patient presents with  . Cough    HPI [name] is a 52 y.o. male.  HPI Patient is a 52 year old male with history of left eye blindness and a 30+ pack-year smoking history who presents to the emergency department as transfer from urgent care for evaluation of concern for possible new onset of atrial fibrillation as well as cough, generalized body aches, and worsening shortness of breath of the past 2 or 3 days.  Patient reports that he woke yesterday morning and had one episode of vomiting prior to work. Reports that he was feeling ill so stayed at home.  Has had increased cough with productive sputum over the past couple of days.  Has had no fevers but has had chills.  No known tick exposures or travel.  Denies any chest pain though does report that he occasionally has rib pain with coughing.  Has been having shortness of breath over the past several months that is unchanged and states he can typically walk to his garage and back up the hill before he has to take a break.  No leg swelling.  No history of DVT or PE.  No history of malignancy.  States that he does not typically see doctors and does not like taking any medications.  Takes no daily medications or inhalers at home.  Pt also reports headache today with "pressure behind both eyes" and congestion. No numbness/weakness. No thunderclap. Reports feeling like he has the flu. No tick exposures and is rarely outdoors per pt. Uncertain of his last flu shot.Took tylenol and phenylephrine this morning around 10am with no change in symptoms.  Past Medical History:  Diagnosis Date  . 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 Detached retina     Patient Active Problem List   Diagnosis Date Noted  . COCAINE DEPENDENCY NOS 12/25/2006  . ALCOHOL  ABUSE, UNSPECIFIED 12/25/2006  . TOBACCO DEPENDENCE 12/25/2006  . DEPRESSIVE DISORDER, NOS 12/25/2006    Past Surgical History:  Procedure Laterality Date  . HERNIA REPAIR Right 1969        Home Medications    Prior to Admission medications   Not on File    Family History Family History  Problem Relation Age of Onset  . Healthy Father     Social History Social History   Tobacco Use  . Smoking status: Current Every Day Smoker    Packs/day: 1.00    Types: Cigarettes  . Smokeless tobacco: Never Used  Substance Use Topics  . Alcohol use: No  . Drug use: No     Allergies   Patient has no known allergies.   Review of Systems Review of Systems  Constitutional: Positive for chills. Negative for fever.  HENT: Positive for congestion.   Eyes: Negative for photophobia and visual disturbance.  Respiratory: Positive for cough and shortness of breath.   Cardiovascular: Negative for chest pain and leg swelling.  Gastrointestinal: Negative for abdominal pain, diarrhea, nausea and vomiting.  Genitourinary: Negative for dysuria and hematuria.  Musculoskeletal: Positive for myalgias. Negative for back pain and neck pain.  Skin: Negative for rash.  Neurological: Positive for headaches. Negative for weakness and numbness.     Physical Exam Updated Vital Signs BP 113/81   Pulse 67  Temp 98.6 F (37 C) (Oral)   Resp (!) 23   SpO2 97%   Physical Exam  Constitutional: No distress.  HENT:  Head: Normocephalic and atraumatic.  Mouth/Throat: Oropharynx is clear and moist.  Eyes: Conjunctivae are normal. Right eye exhibits no discharge. Left eye exhibits no discharge.  Left corneal opacification. Right eye reactive to light.   Neck: Normal range of motion. No tracheal deviation present.  Cardiovascular: Regular rhythm, normal heart sounds and intact distal pulses.  Pulmonary/Chest: Effort normal. No respiratory distress. He has wheezes (few scattered bilat).  Coarse  cough. Speaking full sentences and stable on RA.   Abdominal: Soft. Bowel sounds are normal. He exhibits no distension. There is no tenderness. There is no rebound and no guarding.  Musculoskeletal: He exhibits no edema or deformity.  Neurological: He is alert.  Alert and oriented x3.  Cranial nerves II-XII intact.  No facial asymmetry.  5/5 grip strength bilaterally.  5/5 bicep flexion and tricep extension bilaterally. 5/5 flexion/extension at knee, hip, and ankles. No discoordination of FNF, heel to shin, and ambulates without ataxia or antalgic gait.    Skin: Capillary refill takes 2 to 3 seconds. No rash noted. He is not diaphoretic.  Psychiatric: He has a normal mood and affect.     ED Treatments / Results  Labs (all labs ordered are listed, but only abnormal results are displayed) Labs Reviewed  HEPATIC FUNCTION PANEL - Abnormal; Notable for the following components:      Result Value   Total Protein 9.0 (*)    AST 54 (*)    ALT 87 (*)    All other components within normal limits  BASIC METABOLIC PANEL  LIPASE, BLOOD  MAGNESIUM  I-STAT TROPONIN, ED  I-STAT TROPONIN, ED    EKG EKG Interpretation  Date/Time:  Tuesday June 02 2018 17:34:05 EDT Ventricular Rate:  138 PR Interval:  146 QRS Duration: 82 QT Interval:  342 QTC Calculation: 518 R Axis:   97 Text Interpretation:  Undetermined rhythm Rightward axis Anterior infarct , age undetermined Abnormal ECG similar to previous today concern for MAT Reconfirmed by Frederick Peers [phone]) on 06/02/2018 5:57:52 PM   Radiology Dg Chest 2 View  Result Date: 06/02/2018 CLINICAL DATA:  Chest discomfort with fatigue and vomiting as well as fever 2 days. EXAM: CHEST - 2 VIEW COMPARISON:  08/13/2009 FINDINGS: Lungs are adequately inflated without focal airspace consolidation or effusion. Cardiomediastinal silhouette and remainder of the exam is unchanged. IMPRESSION: No active cardiopulmonary disease. Electronically Signed   By:  Elberta Fortis M.D.   On: 06/02/2018 17:09    Procedures Procedures (including critical care time)  Medications Ordered in ED Medications  metoCLOPramide (REGLAN) injection 10 mg (10 mg Intravenous Given 06/02/18 2040)  diphenhydrAMINE (BENADRYL) capsule 25 mg (25 mg Oral Given 06/02/18 2040)  ipratropium-albuterol (DUONEB) 0.5-2.5 (3) MG/3ML nebulizer solution 3 mL (3 mLs Nebulization Given 06/02/18 2112)  predniSONE (DELTASONE) tablet 60 mg (60 mg Oral Given 06/02/18 2111)  doxycycline (VIBRA-TABS) tablet 100 mg (100 mg Oral Given 06/02/18 2143)     Initial Impression / Assessment and Plan / ED Course  I have reviewed the triage vital signs and the nursing notes.  Pertinent labs & imaging results that were available during my care of the patient were reviewed by me and considered in my medical decision making (see chart for details).    Patient is a 52 year old male with history of left eye blindness and a 30+ pack-year smoking history  who presents to the emergency department as transfer from urgent care for evaluation of concern for possible new onset of atrial fibrillation as well as cough, generalized body aches, and worsening shortness of breath of the past 2 or 3 days.    ECG reviewed with rate approx 140, appears to have variable p-waves, rhythm most consistent with MAT, right axis dev, several abberrant beats, no ST elevation or depression. When compared to 07/2017 new underlying rhythm. CXR with no acute cardiopulm abnormalities. No focal consolidation to suggest PNA. No PTX. No effusions. Very atypical presentation for ACS. Did have scattered wheezing and increased cough. Emphysematous changes on prior CT chest though doesn't carry formal diagnosis of COPD. Symptomatically presents similar to COPD exacerbation. Prednisone and doxy given in ED. Pt continues to go in and out of MAT with HR 120s and then 80s. No leg swelling, immobilization, recent surgeries, hypoxia, or tachypnea to necessitate  PE workup at this time. Discussed case with cardiology (Dr. Shirlee Latch) regarding intermittent MAT and they recommend diltiazem drip and hospitalist admission as may be secondary to acute respiratory illness. Discussed with medicine team who will admit. Stable with no acute events in ED.   Case and plan of care discussed with Dr. Clarene Duke.  Final Clinical Impressions(s) / ED Diagnoses   Final diagnoses:  Multifocal atrial tachycardia [facility])  Wheezing  Bronchitis    ED Discharge Orders    None       Rigoberto Noel, MD 06/03/18 3329    Clarene Duke Ambrose Finland, MD 06/12/18 [phone]

## 2018-06-02 NOTE — Discharge Instructions (Signed)
Go to emergency room

## 2018-06-02 NOTE — ED Triage Notes (Signed)
Pt presents with sudden onset of generalized body aches, productive cough and shortness of breath since yesterday.  Pt was seen at urgent care and referred here with new onset of a-fib.  Pt reports chest pain only when he coughs.

## 2018-06-02 NOTE — ED Provider Notes (Addendum)
[facility]    CSN: [id number] Arrival date & time: 06/02/18  1544     History   Chief Complaint Chief Complaint  Patient presents with  . Emesis  . Fever    HPI [name] is a 52 y.o. male.   HPI  [name] is here complaining of a respiratory infection.  He states that on Saturday and Sunday he had a bit of headache.  It felt "pressure behind my eyes".  He woke up Monday morning at 5 AM with a bad coughing spell, that ended up with protracted vomiting.  He has been having coughing with intermittent vomiting ever since that time.  Appetite is poor.  He feels achy and sore.  He feels like he has the flu.  Whole body hurts.  He is able to keep down some fluids, he states he had a Dr. Reino KentPepper earlier today. The patient states in general he has no medical problems, takes no medications, takes no supplements.  Eats a regular diet. He is a long-standing smoker and does have some shortness of breath with exertion.  He suspects that he has some COPD.  He has not had an x-ray in many years.  He does not have a PCP or go for regular checkups. He does not know that he has any heart disease, high cholesterol, high blood pressure.  He does have a family history of heart problems but not at a young age.  He is never had any chest pain. He is here with his wife.  She reports that he has had some streaks of blood in his emesis when he has the protracted vomiting. He absolutely denies any alcohol.  He denies any drug use.  He states he has never had a drug or alcohol addiction.  He states that "I will not have that in my house". He denies any past history of ulcers or GI bleeding  Past Medical History:  Diagnosis Date  . 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 Detached retina     Patient Active Problem List   Diagnosis Date Noted  . COCAINE DEPENDENCY NOS 12/25/2006  . ALCOHOL ABUSE, UNSPECIFIED 12/25/2006  . TOBACCO DEPENDENCE 12/25/2006  .  DEPRESSIVE DISORDER, NOS 12/25/2006    Past Surgical History:  Procedure Laterality Date  . HERNIA REPAIR Right 1969       Home Medications    Prior to Admission medications   Not on File    Family History Family History  Problem Relation Age of Onset  . Healthy Father     Social History Social History   Tobacco Use  . Smoking status: Current Every Day Smoker    Packs/day: 1.00    Types: Cigarettes  . Smokeless tobacco: Never Used  Substance Use Topics  . Alcohol use: No  . Drug use: No     Allergies   Patient has no known allergies.   Review of Systems Review of Systems   Physical Exam Triage Vital Signs ED Triage Vitals  Enc Vitals Group     BP 06/02/18 1623 110/78     Pulse Rate 06/02/18 1623 (!) 53     Resp 06/02/18 1623 16     Temp 06/02/18 1623 98.1 F (36.7 C)     Temp src --      SpO2 06/02/18 1623 99 %     Weight 06/02/18 1624 189 lb 3.2 oz (85.8  kg)     Height --      Head Circumference --      Peak Flow --      Pain Score 06/02/18 1624 8     Pain Loc --      Pain Edu? --      Excl. in GC? --    No data found.  Updated Vital Signs BP 110/78   Pulse (!) 53   Temp 98.1 F (36.7 C)   Resp 16   Wt 189 lb 3.2 oz (85.8 kg)   SpO2 99%   BMI 27.15 kg/m      Physical Exam  Constitutional: He appears well-developed and well-nourished. No distress.  HENT:  Head: Normocephalic and atraumatic.  Right Ear: External ear normal.  Left Ear: External ear normal.  Mouth/Throat: Oropharynx is clear and moist.  Eyes:  Clouded cornea, blind left eye.  Right eye is normal  Neck: Normal range of motion. No thyromegaly present.  Cardiovascular: Normal rate.  No murmur heard. Irregular rate and rhythm  Pulmonary/Chest: Effort normal. No respiratory distress. He has wheezes.  Scattered wheeze.  Anterior rhonchi.  Harsh cough  Abdominal: Soft. Bowel sounds are normal. He exhibits no distension.  No tenderness.  No organomegaly    Musculoskeletal: Normal range of motion. He exhibits no edema.  No ankle edema.  Diminished pedal pulses.  Lymphadenopathy:    He has no cervical adenopathy.  Neurological: He is alert.  Skin: Skin is warm and dry.  Psychiatric: He has a normal mood and affect. His behavior is normal.     UC Treatments / Results  Labs (all labs ordered are listed, but only abnormal results are displayed) Labs Reviewed  CBC WITH DIFFERENTIAL/PLATELET    EKG-new onset atrial fibrillation with rapid ventricular response.  Possible old anterior MI. No ST changes.  Radiology Dg Chest 2 View  Result Date: 06/02/2018 CLINICAL DATA:  Chest discomfort with fatigue and vomiting as well as fever 2 days. EXAM: CHEST - 2 VIEW COMPARISON:  08/13/2009 FINDINGS: Lungs are adequately inflated without focal airspace consolidation or effusion. Cardiomediastinal silhouette and remainder of the exam is unchanged. IMPRESSION: No active cardiopulmonary disease. Electronically Signed   By: Elberta Fortis M.D.   On: 06/02/2018 17:09    Procedures Procedures (including critical care time)  Medications Ordered in UC Medications - No data to display  Initial Impression / Assessment and Plan / UC Course  I have reviewed the triage vital signs and the nursing notes.  Pertinent labs & imaging results that were available during my care of the patient were reviewed by me and considered in my medical decision making (see chart for details).     Discussed with patient with his fatigue, cough, and new onset of heart disease he needed to go the emergency room for further evaluation.  Wife is here with patient and agrees to take him.  She feels comfortable transporting him. Final Clinical Impressions(s) / UC Diagnoses   Final diagnoses:  Atrial fibrillation, new onset (HCC)  Cough  Hematemesis, presence of nausea not specified     Discharge Instructions     Go to emergency room    ED Prescriptions    None      Controlled Substance Prescriptions [place] Controlled Substance Registry consulted? Not Applicable   Eustace Moore, MD 06/02/18 Hilma Favors, MD 06/02/18 [phone]

## 2018-06-02 NOTE — ED Triage Notes (Signed)
Pt has a cold and congestion. Pt states he has had fever off and on and been vomiting. 2 days.

## 2018-06-03 ENCOUNTER — Observation Stay (HOSPITAL_COMMUNITY): Payer: Self-pay

## 2018-06-03 ENCOUNTER — Other Ambulatory Visit: Payer: Self-pay

## 2018-06-03 ENCOUNTER — Encounter (HOSPITAL_COMMUNITY): Payer: Self-pay | Admitting: Internal Medicine

## 2018-06-03 DIAGNOSIS — I4891 Unspecified atrial fibrillation: Secondary | ICD-10-CM

## 2018-06-03 DIAGNOSIS — J441 Chronic obstructive pulmonary disease with (acute) exacerbation: Secondary | ICD-10-CM

## 2018-06-03 DIAGNOSIS — I4719 Other supraventricular tachycardia: Secondary | ICD-10-CM | POA: Diagnosis present

## 2018-06-03 DIAGNOSIS — I471 Supraventricular tachycardia: Secondary | ICD-10-CM | POA: Diagnosis present

## 2018-06-03 DIAGNOSIS — J209 Acute bronchitis, unspecified: Secondary | ICD-10-CM | POA: Diagnosis present

## 2018-06-03 DIAGNOSIS — E7219 Other disorders of sulfur-bearing amino-acid metabolism: Secondary | ICD-10-CM | POA: Diagnosis present

## 2018-06-03 DIAGNOSIS — J440 Chronic obstructive pulmonary disease with acute lower respiratory infection: Secondary | ICD-10-CM

## 2018-06-03 LAB — CBC WITH DIFFERENTIAL/PLATELET
Abs Immature Granulocytes: 0.1 10*3/uL (ref 0.0–0.1)
Basophils Absolute: 0 10*3/uL (ref 0.0–0.1)
Basophils Relative: 0 %
Eosinophils Absolute: 0.2 10*3/uL (ref 0.0–0.7)
Eosinophils Relative: 1 %
HCT: 45.6 % (ref 39.0–52.0)
Hemoglobin: 14.5 g/dL (ref 13.0–17.0)
Immature Granulocytes: 1 %
Lymphocytes Relative: 17 %
Lymphs Abs: 2.7 10*3/uL (ref 0.7–4.0)
MCH: 27.1 pg (ref 26.0–34.0)
MCHC: 31.8 g/dL (ref 30.0–36.0)
MCV: 85.2 fL (ref 78.0–100.0)
Monocytes Absolute: 1.9 10*3/uL — ABNORMAL HIGH (ref 0.1–1.0)
Monocytes Relative: 12 %
Neutro Abs: 10.5 10*3/uL — ABNORMAL HIGH (ref 1.7–7.7)
Neutrophils Relative %: 69 %
Platelets: 193 10*3/uL (ref 150–400)
RBC: 5.35 MIL/uL (ref 4.22–5.81)
RDW: 14.3 % (ref 11.5–15.5)
WBC: 15.3 10*3/uL — ABNORMAL HIGH (ref 4.0–10.5)

## 2018-06-03 LAB — BASIC METABOLIC PANEL
ANION GAP: 12 (ref 5–15)
BUN: 12 mg/dL (ref 6–20)
CHLORIDE: 100 mmol/L (ref 98–111)
CO2: 24 mmol/L (ref 22–32)
Calcium: 9.2 mg/dL (ref 8.9–10.3)
Creatinine, Ser: 1.08 mg/dL (ref 0.61–1.24)
GFR calc Af Amer: >60 mL/min (ref >60)
GLUCOSE: 206 mg/dL — AB (ref 70–99)
Potassium: 4.2 mmol/L (ref 3.5–5.1)
Sodium: 136 mmol/L (ref 135–145)

## 2018-06-03 LAB — TSH: TSH: 1.15 u[IU]/mL (ref 0.350–4.500)

## 2018-06-03 LAB — CBC
HEMATOCRIT: 49.2 % (ref 39.0–52.0)
HEMOGLOBIN: 15.8 g/dL (ref 13.0–17.0)
MCH: 27.6 pg (ref 26.0–34.0)
MCHC: 32.1 g/dL (ref 30.0–36.0)
MCV: 85.9 fL (ref 78.0–100.0)
Platelets: 193 10*3/uL (ref 150–400)
RBC: 5.73 MIL/uL (ref 4.22–5.81)
RDW: 14.4 % (ref 11.5–15.5)
WBC: 15.3 10*3/uL — ABNORMAL HIGH (ref 4.0–10.5)

## 2018-06-03 LAB — TROPONIN I
Troponin I: <0.03 ng/mL (ref <0.03)
Troponin I: <0.03 ng/mL (ref <0.03)

## 2018-06-03 LAB — HIV ANTIBODY (ROUTINE TESTING W REFLEX): HIV SCREEN 4TH GENERATION: NONREACTIVE

## 2018-06-03 LAB — D-DIMER, QUANTITATIVE: D-Dimer, Quant: 0.99 ug/mL-FEU — ABNORMAL HIGH (ref 0.00–0.50)

## 2018-06-03 MED ORDER — IOPAMIDOL (ISOVUE-370) INJECTION 76%
100.0000 mL | Freq: Once | INTRAVENOUS | Status: AC | PRN
  Administered 2018-06-03: 100 mL via INTRAVENOUS

## 2018-06-03 MED ORDER — ACETAMINOPHEN 325 MG PO TABS: 650.0000 mg | ORAL_TABLET | Freq: Four times a day (QID) | ORAL | Status: DC | PRN

## 2018-06-03 MED ORDER — DOXYCYCLINE HYCLATE 100 MG PO TABS
100.0000 mg | ORAL_TABLET | Freq: Two times a day (BID) | ORAL | Status: DC
  Administered 2018-06-03 – 2018-06-04 (×3): 100 mg via ORAL
  Filled 2018-06-03 (×3): qty 1

## 2018-06-03 MED ORDER — IOPAMIDOL (ISOVUE-370) INJECTION 76%
INTRAVENOUS | Status: AC
  Filled 2018-06-03: qty 100

## 2018-06-03 MED ORDER — DILTIAZEM HCL ER COATED BEADS 120 MG PO CP24
120.0000 mg | ORAL_CAPSULE | Freq: Every day | ORAL | Status: DC
  Administered 2018-06-03 – 2018-06-04 (×2): 120 mg via ORAL
  Filled 2018-06-03 (×2): qty 1

## 2018-06-03 MED ORDER — ONDANSETRON HCL 4 MG/2ML IJ SOLN: 4.0000 mg | Freq: Four times a day (QID) | INTRAMUSCULAR | Status: DC | PRN

## 2018-06-03 MED ORDER — ACETAMINOPHEN 650 MG RE SUPP: 650.0000 mg | Freq: Four times a day (QID) | RECTAL | Status: DC | PRN

## 2018-06-03 MED ORDER — LEVALBUTEROL HCL 0.63 MG/3ML IN NEBU
0.6300 mg | INHALATION_SOLUTION | Freq: Two times a day (BID) | RESPIRATORY_TRACT | Status: DC
  Administered 2018-06-04: 0.63 mg via RESPIRATORY_TRACT
  Filled 2018-06-03 (×2): qty 3

## 2018-06-03 MED ORDER — DILTIAZEM HCL-DEXTROSE 100-5 MG/100ML-% IV SOLN (PREMIX)
5.0000 mg/h | INTRAVENOUS | Status: DC
  Administered 2018-06-03: 5 mg/h via INTRAVENOUS
  Filled 2018-06-03: qty 100

## 2018-06-03 MED ORDER — LEVALBUTEROL HCL 0.63 MG/3ML IN NEBU
0.6300 mg | INHALATION_SOLUTION | Freq: Four times a day (QID) | RESPIRATORY_TRACT | Status: DC
  Administered 2018-06-03: 0.63 mg via RESPIRATORY_TRACT
  Filled 2018-06-03: qty 3

## 2018-06-03 MED ORDER — LEVALBUTEROL HCL 0.63 MG/3ML IN NEBU: 0.6300 mg | INHALATION_SOLUTION | Freq: Four times a day (QID) | RESPIRATORY_TRACT | Status: DC | PRN

## 2018-06-03 MED ORDER — ONDANSETRON HCL 4 MG PO TABS: 4.0000 mg | ORAL_TABLET | Freq: Four times a day (QID) | ORAL | Status: DC | PRN

## 2018-06-03 MED ORDER — DILTIAZEM LOAD VIA INFUSION
10.0000 mg | Freq: Once | INTRAVENOUS | Status: AC
  Administered 2018-06-03: 10 mg via INTRAVENOUS
  Filled 2018-06-03: qty 10

## 2018-06-03 MED ORDER — ENOXAPARIN SODIUM 40 MG/0.4ML ~~LOC~~ SOLN: 40.0000 mg | Freq: Every day | SUBCUTANEOUS | Status: DC

## 2018-06-03 MED ORDER — SODIUM CHLORIDE 0.9 % IV SOLN
INTRAVENOUS | Status: DC
  Administered 2018-06-03: 23:00:00 via INTRAVENOUS

## 2018-06-03 NOTE — H&P (Signed)
History and Physical    Joel FlattenVernon L Thien ZOX:[id number] DOB: [birth year] DOA: 06/02/2018  PCP: Patient, No Pcp Per  Patient coming from: Home.  Chief Complaint: Flulike symptoms.  HPI: Joel FlattenVernon L Forti is a 52 y.o. male with history of tobacco abuse left blind eye has been experiencing flulike symptoms over the last 2 days with cough productive sputum wheezing headache body aches and had gone to the urgent care center where patient was found to be in tachycardia and was referred to the ER.  Denies any chest pain.  Patient had thrown up a couple of times but denies any abdominal pain.  Vomitus eventually was mildly blood-tinged.  ED Course: In the ER patient had a heart rate around 130 bpm EKG was showing possibility of A. fib and ER physician discussed with neurologist who at this time felt the rhythm was mostly multifocal atrial tachycardia and was started on Cardizem infusion.  For patient on exam was wheezing he was given nebulizer treatment and doxycycline.  Review of Systems: As per HPI, rest all negative.   Past Medical History:  Diagnosis Date  . 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 Detached retina     Past Surgical History:  Procedure Laterality Date  . HERNIA REPAIR Right 1969     reports that he has been smoking cigarettes.  He has been smoking about 1.00 pack per day. He has never used smokeless tobacco. He reports that he does not drink alcohol or use drugs.  No Known Allergies  Family history -Father had CAD status post CABG.   Prior to Admission medications   Not on File    Physical Exam: Vitals:   06/03/18 0045 06/03/18 0100 06/03/18 0115 06/03/18 0130  BP: 113/81 113/81 123/83   Pulse: 67 (!) 59 (!) 111 76  Resp: (!) 23 (!) 24 16 (!) 25  Temp:      TempSrc:      SpO2: 97%  97%       Constitutional: Moderately built and nourished. Vitals:   06/03/18 0045 06/03/18 0100 06/03/18 0115 06/03/18 0130  BP: 113/81 113/81  123/83   Pulse: 67 (!) 59 (!) 111 76  Resp: (!) 23 (!) 24 16 (!) 25  Temp:      TempSrc:      SpO2: 97%  97%    Eyes: Anicteric no pallor. ENMT: No discharge from the ears eyes nose or mouth. Neck: No mass palpated no neck rigidity. Respiratory: Mild expiratory wheeze heard no crepitations. Cardiovascular: S1-S2 heard no murmurs appreciated. Abdomen: Soft nontender bowel sounds present. Musculoskeletal: No edema.  No joint effusion. Skin: No rash. Neurologic: Alert awake oriented to time place and person.  Moves all extremities. Psychiatric: Appears normal.  Normal affect.   Labs on Admission: I have personally reviewed following labs and imaging studies  CBC: Recent Labs  Lab 06/02/18 1729  WBC 13.4*  NEUTROABS 9.0*  HGB 16.0  HCT 51.3  MCV 87.4  PLT 186   Basic Metabolic Panel: Recent Labs  Lab 06/02/18 1729 06/02/18 2109  NA 138  --   K 4.3  --   CL 103  --   CO2 26  --   GLUCOSE 77  --   BUN 11  --   CREATININE 0.83  --   CALCIUM 9.2  --   MG  --  1.9   GFR: CrCl cannot be calculated (Unknown ideal  weight.). Liver Function Tests: Recent Labs  Lab 06/02/18 1729  AST 54*  ALT 87*  ALKPHOS 63  BILITOT 0.5  PROT 9.0*  ALBUMIN 4.0   Recent Labs  Lab 06/02/18 1729  LIPASE 38   No results for input(s): AMMONIA in the last 168 hours. Coagulation Profile: No results for input(s): INR, PROTIME in the last 168 hours. Cardiac Enzymes: No results for input(s): CKTOTAL, CKMB, CKMBINDEX, TROPONINI in the last 168 hours. BNP (last 3 results) No results for input(s): PROBNP in the last 8760 hours. HbA1C: No results for input(s): HGBA1C in the last 72 hours. CBG: No results for input(s): GLUCAP in the last 168 hours. Lipid Profile: No results for input(s): CHOL, HDL, LDLCALC, TRIG, CHOLHDL, LDLDIRECT in the last 72 hours. Thyroid Function Tests: No results for input(s): TSH, T4TOTAL, FREET4, T3FREE, THYROIDAB in the last 72 hours. Anemia Panel: No  results for input(s): VITAMINB12, FOLATE, FERRITIN, TIBC, IRON, RETICCTPCT in the last 72 hours. Urine analysis:    Component Value Date/Time   COLORURINE YELLOW 06/21/2007 2206   APPEARANCEUR CLOUDY (A) 06/21/2007 2206   LABSPEC 1.020 06/21/2007 2206   PHURINE 7.5 06/21/2007 2206   GLUCOSEU NEGATIVE 06/21/2007 2206   HGBUR NEGATIVE 06/21/2007 2206   BILIRUBINUR NEGATIVE 06/21/2007 2206   KETONESUR NEGATIVE 06/21/2007 2206   PROTEINUR NEGATIVE 06/21/2007 2206   UROBILINOGEN 1.0 06/21/2007 2206   NITRITE NEGATIVE 06/21/2007 2206   LEUKOCYTESUR  06/21/2007 2206    NEGATIVE MICROSCOPIC NOT DONE ON URINES WITH NEGATIVE PROTEIN, BLOOD, LEUKOCYTES, NITRITE, OR GLUCOSE <1000 mg/dL.   Sepsis Labs: @LABRCNTIP (procalcitonin:4,lacticidven:4) )No results found for this or any previous visit (from the past 240 hour(s)).   Radiological Exams on Admission: Dg Chest 2 View  Result Date: 06/02/2018 CLINICAL DATA:  Chest discomfort with fatigue and vomiting as well as fever 2 days. EXAM: CHEST - 2 VIEW COMPARISON:  08/13/2009 FINDINGS: Lungs are adequately inflated without focal airspace consolidation or effusion. Cardiomediastinal silhouette and remainder of the exam is unchanged. IMPRESSION: No active cardiopulmonary disease. Electronically Signed   By: Elberta Fortis M.D.   On: 06/02/2018 17:09    EKG: Independently reviewed.  Multifocal atrial tachycardia.  Assessment/Plan Principal Problem:   Multifocal atrial tachycardia (HCC) Active Problems:   Acute bronchitis   MAT (methionine adenosyltransferase) deficiency (HCC)    1. Multifocal atrial tachycardia -was started on Cardizem infusion.  Will check TSH.  Will place patient on p.o. Cardizem and wean off IV Cardizem. 2. Acute bronchitis with history of tobacco abuse with flulike symptoms-advised to quit smoking.  Patient is on nebulizer treatment and doxycycline.  Patient states his headache is resolved.   3. Nausea vomiting -patient had  couple of episodes of vomiting and eventually was mildly blood-tinged.  Follow CBC.  LFTs are mildly elevated.  Calculated hepatitis panel abdomen appears benign.  If there is any further vomiting or LFTs worsen may check further scans.   DVT prophylaxis: SCDs for now. Code Status: Full code. Family Communication: Patient's family. Disposition Plan: Home. Consults called: ER physician discussed cardiologist. Admission status: Observation.   Eduard Clos MD [facility] Pager [phone].  If 7PM-7AM, please contact night-coverage [email or web address] Password [email or web address]  06/03/2018, 1:55 AM

## 2018-06-03 NOTE — Progress Notes (Signed)
PROGRESS NOTE    [name]  ZOX:[id number] DOB: 1965-12-03 DOA: 06/02/2018 PCP: Patient, No Pcp Per  Outpatient Specialists:     Brief Narrative:  Patient is a 52 year old male with past medical history significant for tobacco abuse, likely undiagnosed COPD, blindness of the left eye.  Patient was seen in urgent care with lactulose COPD exacerbation and acute bronchitis.  Patient was noted to be in atrial fibrillation with rapid ventricular response.  On presentation to the hospital, the patient's heart rate was 130 bpm.  No prior history of atrial fibrillation.  Patient has been admitted for further assessment and management.   Assessment & Plan:   Principal Problem:   Multifocal atrial tachycardia (HCC) Active Problems:   Acute bronchitis   MAT (methionine adenosyltransferase) deficiency (HCC)   Atrial fibrillation with rapid ventricular response: Patient is back to normal sinus rhythm, rate controlled. Chads vas score is 0 We will pursue echocardiogram. We will also check d-dimer.  Acute bronchitis: Continue steroids, neb treatment and antibiotics. Incentive spirometry.  Likely COPD with exacerbation: See above. Patient will need to follow with pulmonologist on discharge. Patient will need to undergo formal testing for COPD to confirm diagnosis.  DVT prophylaxis: SCD Code Status: Full Family Communication: Wife Disposition Plan: Home eventually   Consultants:   None  Procedures:   None  Antimicrobials:   Doxycycline   Subjective: No shortness of breath. No chest pain. No fever or chills.  Objective: Vitals:   06/03/18 0342 06/03/18 0821 06/03/18 0856 06/03/18 1201  BP: 124/67  102/65 115/74  Pulse: 73  92 90  Resp: (!) 27  (!) 27 (!) 27  Temp: 97.9 F (36.6 C)     TempSrc: Oral     SpO2: 92% 97% 95%   Weight: 83.5 kg (184 lb)     Height: 5\' 10"  (1.778 m)       Intake/Output Summary (Last 24 hours) at 06/03/2018 2046 Last data filed at  06/03/2018 0500 Gross per 24 hour  Intake 257.58 ml  Output -  Net 257.58 ml   Filed Weights   06/03/18 0342  Weight: 83.5 kg (184 lb)    Examination:  General exam: Appears calm and comfortable  Respiratory system: Decreased air entry.   Cardiovascular system: S1 & S2 irregular Gastrointestinal system: Abdomen is nondistended, soft and nontender. No organomegaly or masses felt. Normal bowel sounds heard. Central nervous system: Alert and oriented. No focal neurological deficits. Extremities: No leg edema     Data Reviewed: I have personally reviewed following labs and imaging studies  CBC: Recent Labs  Lab 06/02/18 1729 06/03/18 0720 06/03/18 1623  WBC 13.4* 15.3* 15.3*  NEUTROABS 9.0*  --  10.5*  HGB 16.0 15.8 14.5  HCT 51.3 49.2 45.6  MCV 87.4 85.9 85.2  PLT 186 193 193   Basic Metabolic Panel: Recent Labs  Lab 06/02/18 1729 06/02/18 2109 06/03/18 0720  NA 138  --  136  K 4.3  --  4.2  CL 103  --  100  CO2 26  --  24  GLUCOSE 77  --  206*  BUN 11  --  12  CREATININE 0.83  --  1.08  CALCIUM 9.2  --  9.2  MG  --  1.9  --    GFR: Estimated Creatinine Clearance: 81.7 mL/min (by C-G formula based on SCr of 1.08 mg/dL). Liver Function Tests: Recent Labs  Lab 06/02/18 1729  AST 54*  ALT 87*  ALKPHOS 63  BILITOT 0.5  PROT 9.0*  ALBUMIN 4.0   Recent Labs  Lab 06/02/18 1729  LIPASE 38   No results for input(s): AMMONIA in the last 168 hours. Coagulation Profile: No results for input(s): INR, PROTIME in the last 168 hours. Cardiac Enzymes: Recent Labs  Lab 06/03/18 0241 06/03/18 0720 06/03/18 1332  TROPONINI <0.03 <0.03 <0.03   BNP (last 3 results) No results for input(s): PROBNP in the last 8760 hours. HbA1C: No results for input(s): HGBA1C in the last 72 hours. CBG: No results for input(s): GLUCAP in the last 168 hours. Lipid Profile: No results for input(s): CHOL, HDL, LDLCALC, TRIG, CHOLHDL, LDLDIRECT in the last 72 hours. Thyroid  Function Tests: Recent Labs    06/03/18 0241  TSH 1.150   Anemia Panel: No results for input(s): VITAMINB12, FOLATE, FERRITIN, TIBC, IRON, RETICCTPCT in the last 72 hours. Urine analysis:    Component Value Date/Time   COLORURINE YELLOW 06/21/2007 2206   APPEARANCEUR CLOUDY (A) 06/21/2007 2206   LABSPEC 1.020 06/21/2007 2206   PHURINE 7.5 06/21/2007 2206   GLUCOSEU NEGATIVE 06/21/2007 2206   HGBUR NEGATIVE 06/21/2007 2206   BILIRUBINUR NEGATIVE 06/21/2007 2206   KETONESUR NEGATIVE 06/21/2007 2206   PROTEINUR NEGATIVE 06/21/2007 2206   UROBILINOGEN 1.0 06/21/2007 2206   NITRITE NEGATIVE 06/21/2007 2206   LEUKOCYTESUR  06/21/2007 2206    NEGATIVE MICROSCOPIC NOT DONE ON URINES WITH NEGATIVE PROTEIN, BLOOD, LEUKOCYTES, NITRITE, OR GLUCOSE <1000 mg/dL.   Sepsis Labs: @LABRCNTIP (procalcitonin:4,lacticidven:4)  )No results found for this or any previous visit (from the past 240 hour(s)).       Radiology Studies: Dg Chest 2 View  Result Date: 06/02/2018 CLINICAL DATA:  Chest discomfort with fatigue and vomiting as well as fever 2 days. EXAM: CHEST - 2 VIEW COMPARISON:  08/13/2009 FINDINGS: Lungs are adequately inflated without focal airspace consolidation or effusion. Cardiomediastinal silhouette and remainder of the exam is unchanged. IMPRESSION: No active cardiopulmonary disease. Electronically Signed   By: Elberta Fortisaniel  Boyle M.D.   On: 06/02/2018 17:09        Scheduled Meds: . diltiazem  120 mg Oral Daily  . doxycycline  100 mg Oral Q12H  . levalbuterol  0.63 mg Nebulization BID   Continuous Infusions:   LOS: 0 days    Time spent: 25 Minutes    Berton MountSylvester Tiare Rohlman, MD  [facility] Pager #: [phone] 7PM-7AM contact night coverage as above

## 2018-06-03 NOTE — ED Notes (Signed)
Pt heart rate intermittently going to 130's. EKG shot, edp notified.

## 2018-06-04 ENCOUNTER — Observation Stay (HOSPITAL_BASED_OUTPATIENT_CLINIC_OR_DEPARTMENT_OTHER): Payer: Self-pay

## 2018-06-04 DIAGNOSIS — I4891 Unspecified atrial fibrillation: Secondary | ICD-10-CM

## 2018-06-04 DIAGNOSIS — I471 Supraventricular tachycardia: Secondary | ICD-10-CM

## 2018-06-04 LAB — ECHOCARDIOGRAM COMPLETE
Height: 70 in
Weight: 2944 oz

## 2018-06-04 LAB — HEPATITIS PANEL, ACUTE
HEP A IGM: NEGATIVE
HEP B C IGM: NEGATIVE
HEP B S AG: NEGATIVE

## 2018-06-04 MED ORDER — DOXYCYCLINE HYCLATE 100 MG PO TABS: 100.0000 mg | ORAL_TABLET | Freq: Two times a day (BID) | ORAL | 0 refills | Status: AC

## 2018-06-04 MED ORDER — FLUTICASONE-UMECLIDIN-VILANT 100-62.5-25 MCG/INH IN AEPB: 1.0000 | INHALATION_SPRAY | Freq: Every day | RESPIRATORY_TRACT | 1 refills | Status: DC

## 2018-06-04 MED ORDER — DILTIAZEM HCL ER COATED BEADS 120 MG PO CP24: 120.0000 mg | ORAL_CAPSULE | Freq: Every day | ORAL | 0 refills | Status: DC

## 2018-06-04 NOTE — Progress Notes (Signed)
  Echocardiogram 2D Echocardiogram has been performed.  Celene SkeenVijay  Ajai Terhaar 06/04/2018, 12:24 PM

## 2018-06-04 NOTE — Discharge Summary (Signed)
Physician Discharge Summary  Patient ID: Shelly FlattenVernon L Gayman MRN: [id number] DOB/AGE: July 24, 1966 52 y.o.  Admit date: 06/02/2018 Discharge date: 06/04/2018  Admission Diagnoses:  Discharge Diagnoses:  Principal Problem:   Atrial fibrillation with rapid ventricular response. Active Problems:   Acute bronchitis   Tobacco use disorder    Likely undiagnosed COPD  Discharged Condition: stable  Hospital Course: Patient is a 52 year old male with past medical history significant for tobacco abuse, likely undiagnosed COPD, blindness of the left eye.  Patient was seen in an urgent care facility for likely undiagnosed COPD exacerbation and acute bronchitis.  Patient was noted to be in atrial fibrillation with rapid ventricular response.    Patient was advised to come to the hospital for further assessment and management.  On presentation to the hospital, the patient was in atrial fibrillation with rapid ventricular response (heart rate of 130 bpm).  No prior history of atrial fibrillation.    No history of congestive heart failure, hypertension, coronary artery disease, diabetes mellitus, thromboembolic disease or prior strokes.  Patient was admitted for further assessment and management.  Patient was started on Cardizem drip.  D-dimer was checked and it was elevated.  However, CT of the chest was negative for pulmonary embolism.  Echocardiogram was within normal range, with normal ejection fraction.  Patient is back to normal sinus rhythm.  Vision will be discharged back home on doxycycline, Cardizem and Trelegy.  Consults: None  Significant Diagnostic Studies: Echo.  CTA of the chest.   Discharge Exam: Blood pressure 127/81, pulse 62, temperature 98.4 F (36.9 C), temperature source Oral, resp. rate (!) 27, height 5\' 10"  (1.778 m), weight 83.5 kg, SpO2 97 %.   Disposition: Discharge disposition: 01-Home or Self Care  Discharge Instructions    Diet - low sodium heart healthy   Complete by:  As  directed    Increase activity slowly   Complete by:  As directed      Allergies as of 06/04/2018   No Known Allergies     Medication List    TAKE these medications   diltiazem 120 MG 24 hr capsule Commonly known as:  CARDIZEM CD Take 1 capsule (120 mg total) by mouth daily. Start taking on:  06/05/2018   doxycycline 100 MG tablet Commonly known as:  VIBRA-TABS Take 1 tablet (100 mg total) by mouth every 12 (twelve) hours for 5 days.   Fluticasone-Umeclidin-Vilant 100-62.5-25 MCG/INH Aepb Inhale 1 puff into the lungs daily.        SignedBarnetta Chapel: Dariusz Brase I Fujiko Picazo 06/04/2018, 5:31 PM

## 2018-06-05 NOTE — Progress Notes (Signed)
06/05/18 After discharge note: received call from pt's wife Crystal- regarding pt's medications. They were able to get abx and cardiac meds but unable to afford inhaler. Wife also trying to [facility] but no appointment available. Discussed concerns for PCP and medication needs- MATCH program explained to wife and also offered to call clinics for f/u appointment. CM to [facility] pt [facility] then wife will f/u to see about changing to a more affordable option with PCP. CM will also call [facility] for f/u appointment. Discharging MD is not available today for any change in prescriptions. CM will f/u with wife once calls made to clinic and pharmacy.  Appointment was able to be made with [facility] for Sept[facility] 12 at [facility]- MATCH letter done and [facility] to [facility] [address]- ([phone])- [facility] and wife to come and pick inhaler up once filled under MATCH. CM then called wife back and provided info on [facility] and appointment along with [facility]. Also informed her that [facility]- wife to f/u with pharmcy- and voices understanding of $3 copay and one time use for medication fill.

## 2018-07-03 ENCOUNTER — Other Ambulatory Visit (INDEPENDENT_AMBULATORY_CARE_PROVIDER_SITE_OTHER): Payer: Self-pay | Admitting: Physician Assistant

## 2018-07-03 ENCOUNTER — Telehealth (INDEPENDENT_AMBULATORY_CARE_PROVIDER_SITE_OTHER): Payer: Self-pay | Admitting: General Practice

## 2018-07-03 MED ORDER — DILTIAZEM HCL ER COATED BEADS 120 MG PO CP24: 120.0000 mg | ORAL_CAPSULE | Freq: Every day | ORAL | 0 refills | Status: DC

## 2018-07-03 NOTE — Telephone Encounter (Signed)
Patient called requesting medication refill for   diltiazem (CARDIZEM CD) 120 MG 24 hr capsule   Patient uses   [id number] [address], [place] - [address]  Patient has an appointment on 07-09-18 @ 9:30 am  Please Advice [phone] Or [phone] (Wife)

## 2018-07-03 NOTE — Telephone Encounter (Signed)
Refill sent.

## 2018-07-03 NOTE — Telephone Encounter (Signed)
Patient has new patient appointment on 9/12. Wants to know if it is ok for him to be without Diltiazem and if its not can he get a refill before his appointment. Maryjean Morn, CMA

## 2018-07-09 ENCOUNTER — Encounter (INDEPENDENT_AMBULATORY_CARE_PROVIDER_SITE_OTHER): Payer: Self-pay | Admitting: Physician Assistant

## 2018-07-09 ENCOUNTER — Other Ambulatory Visit: Payer: Self-pay

## 2018-07-09 ENCOUNTER — Ambulatory Visit (INDEPENDENT_AMBULATORY_CARE_PROVIDER_SITE_OTHER): Payer: Self-pay | Admitting: Physician Assistant

## 2018-07-09 VITALS — BP 128/89 | HR 70 | Temp 97.8°F | Ht 70.0 in | Wt 191.4 lb

## 2018-07-09 DIAGNOSIS — I4891 Unspecified atrial fibrillation: Secondary | ICD-10-CM

## 2018-07-09 DIAGNOSIS — F17200 Nicotine dependence, unspecified, uncomplicated: Secondary | ICD-10-CM

## 2018-07-09 DIAGNOSIS — R0602 Shortness of breath: Secondary | ICD-10-CM

## 2018-07-09 DIAGNOSIS — Z1211 Encounter for screening for malignant neoplasm of colon: Secondary | ICD-10-CM

## 2018-07-09 DIAGNOSIS — Z09 Encounter for follow-up examination after completed treatment for conditions other than malignant neoplasm: Secondary | ICD-10-CM

## 2018-07-09 MED ORDER — FLUTICASONE FUROATE-VILANTEROL 200-25 MCG/INH IN AEPB: 1.0000 | INHALATION_SPRAY | Freq: Every day | RESPIRATORY_TRACT | 2 refills | Status: DC

## 2018-07-09 MED ORDER — LISINOPRIL 10 MG PO TABS: 10.0000 mg | ORAL_TABLET | Freq: Every day | ORAL | 3 refills | Status: DC

## 2018-07-09 MED ORDER — NICOTINE 21 MG/24HR TD PT24: 21.0000 mg | MEDICATED_PATCH | Freq: Every day | TRANSDERMAL | 1 refills | Status: DC

## 2018-07-09 NOTE — Patient Instructions (Signed)

## 2018-07-09 NOTE — Progress Notes (Signed)
Subjective:  Patient ID: [name], male    DOB: 1966-07-24  Age: 52 y.o. MRN: [id number]  CC: hospital f/u   HPI [name] is a 52 y.o. male with a medical history of Afib, multifocal atrial tachycardia, tobacco abuse, and blindness of left eye presents as a new pt on hospital f/u. Went to Urgent Care on 06/03/18 for SOB and incidentally found to be in Afib. He was sent to the ED and was found to have a positive D-dimer but a negative CTPA. Echocardiogram was WNL with normal EF. Returned back to [facility] with Cardizem drip. Discharged on 06/04/18. Pt states he has been feeling better since his release from ED. Taking Diltiazem as directed. Has not felt palpitations. Endorses fatigue but thinks it may be attributed to Cardizem. Pt continues to smoke. Smoking since he was [age] years old. Smoked a pack and a half a day. Now decreased to a pack and a quarter per day. Tried vaping, tarter cream with little to no relief. Prescribed Trelegy Ellipta with decrease in SOB but does not like using due to altered taste. Does not endorse any other symptoms or complaints.     Outpatient Medications Prior to Visit  Medication Sig Dispense Refill  . diltiazem (CARDIZEM CD) 120 MG 24 hr capsule Take 1 capsule (120 mg total) by mouth daily. 30 capsule 0  . Fluticasone-Umeclidin-Vilant (TRELEGY ELLIPTA) 100-62.5-25 MCG/INH AEPB Inhale 1 puff into the lungs daily. 1 each 1   No facility-administered medications prior to visit.      ROS Review of Systems  Constitutional: Negative for chills, fever and malaise/fatigue.  Eyes: Negative for blurred vision.  Respiratory: Negative for shortness of breath.   Cardiovascular: Negative for chest pain and palpitations.  Gastrointestinal: Negative for abdominal pain and nausea.  Genitourinary: Negative for dysuria and hematuria.  Musculoskeletal: Negative for joint pain and myalgias.  Skin: Negative for rash.  Neurological: Negative for tingling and headaches.        Altered taste  Psychiatric/Behavioral: Negative for depression. The patient is not nervous/anxious.     Objective:  Ht 5\' 10"  (1.778 m)   Wt 191 lb 6.4 oz (86.8 kg)   BMI 27.46 kg/m   Vitals:   07/09/18 0835  BP: 128/89  Pulse: 70  Temp: 97.8 F (36.6 C)  TempSrc: Oral  SpO2: 94%  Weight: 191 lb 6.4 oz (86.8 kg)  Height: 5\' 10"  (1.778 m)      Physical Exam  Constitutional: He is oriented to person, place, and time.  Well developed, well nourished, NAD, polite  HENT:  Head: Normocephalic and atraumatic.  Eyes: No scleral icterus.  Neck: Normal range of motion. Neck supple. No thyromegaly present.  Cardiovascular: Normal rate, regular rhythm and normal heart sounds.  No LE edema bilaterally  Pulmonary/Chest: Effort normal and breath sounds normal. No respiratory distress.  Abdominal: Soft. Bowel sounds are normal. There is no tenderness.  Musculoskeletal: He exhibits no edema.  Neurological: He is alert and oriented to person, place, and time.  Skin: Skin is warm and dry. No rash noted. No erythema. No pallor.  Psychiatric: He has a normal mood and affect. His behavior is normal. Thought content normal.  Vitals reviewed.    Assessment & Plan:    1. Atrial fibrillation, unspecified type [facility](HCC) - Ambulatory referral to Cardiology. Seems stable on Cardizem, asymptomatic.   2. Tobacco use disorder - Begin nicotine (NICODERM CQ - DOSED IN MG/24 HOURS) 21 mg/24hr patch; Place 1  patch (21 mg total) onto the skin daily.  Dispense: 28 patch; Refill: 1  3. Shortness of breath - Ambulatory referral to Pulmonology - fluticasone furoate-vilanterol (BREO ELLIPTA) 200-25 MCG/INH AEPB; Inhale 1 puff into the lungs daily.  Dispense: 1 each; Refill: 2. Advised to rinse mouth out after use.  - Stop Trelegy Ellipta due to altered taste.  4. Hospital discharge follow-up - Notes reviewed.  5. Special screening for malignant neoplasms, colon - Fecal occult blood,  imunochemical   Meds ordered this encounter  Medications  . nicotine (NICODERM CQ - DOSED IN MG/24 HOURS) 21 mg/24hr patch    Sig: Place 1 patch (21 mg total) onto the skin daily.    Dispense:  28 patch    Refill:  1    Order Specific Question:   Supervising Provider    Answer:   Hoy Register [[id number]]  . fluticasone furoate-vilanterol (BREO ELLIPTA) 200-25 MCG/INH AEPB    Sig: Inhale 1 puff into the lungs daily.    Dispense:  1 each    Refill:  2    Order Specific Question:   Supervising Provider    Answer:   Hoy Register [[id number]]    Follow-up: Return in about 4 weeks (around 08/06/2018) for tobacco use.   Loletta Specter PA

## 2018-07-28 ENCOUNTER — Other Ambulatory Visit (INDEPENDENT_AMBULATORY_CARE_PROVIDER_SITE_OTHER): Payer: Self-pay | Admitting: Physician Assistant

## 2018-07-28 NOTE — Telephone Encounter (Signed)
FWD to PCP. Tempestt S Roberts, CMA  

## 2018-08-03 ENCOUNTER — Encounter (INDEPENDENT_AMBULATORY_CARE_PROVIDER_SITE_OTHER): Payer: Self-pay | Admitting: Physician Assistant

## 2018-08-03 ENCOUNTER — Other Ambulatory Visit: Payer: Self-pay

## 2018-08-03 ENCOUNTER — Ambulatory Visit (INDEPENDENT_AMBULATORY_CARE_PROVIDER_SITE_OTHER): Payer: Self-pay | Admitting: Physician Assistant

## 2018-08-03 VITALS — BP 115/72 | HR 59 | Temp 97.8°F | Ht 70.0 in | Wt 193.0 lb

## 2018-08-03 DIAGNOSIS — F17200 Nicotine dependence, unspecified, uncomplicated: Secondary | ICD-10-CM

## 2018-08-03 DIAGNOSIS — Z760 Encounter for issue of repeat prescription: Secondary | ICD-10-CM

## 2018-08-03 DIAGNOSIS — R0602 Shortness of breath: Secondary | ICD-10-CM

## 2018-08-03 MED ORDER — VARENICLINE TARTRATE 1 MG PO TABS: 1.0000 mg | ORAL_TABLET | Freq: Two times a day (BID) | ORAL | 2 refills | Status: DC

## 2018-08-03 MED ORDER — FLUTICASONE FUROATE-VILANTEROL 200-25 MCG/INH IN AEPB: 1.0000 | INHALATION_SPRAY | Freq: Every day | RESPIRATORY_TRACT | 5 refills | Status: DC

## 2018-08-03 MED ORDER — LISINOPRIL 10 MG PO TABS: 10.0000 mg | ORAL_TABLET | Freq: Every day | ORAL | 3 refills | Status: DC

## 2018-08-03 MED ORDER — DILTIAZEM HCL ER COATED BEADS 120 MG PO CP24: ORAL_CAPSULE | ORAL | 3 refills | Status: DC

## 2018-08-03 MED ORDER — VARENICLINE TARTRATE 0.5 MG X 11 & 1 MG X 42 PO MISC: ORAL | 0 refills | Status: DC

## 2018-08-03 NOTE — Progress Notes (Signed)
Subjective:  Patient ID: [name], male    DOB: 07/10/1966  Age: 52 y.o. MRN: [id number]  CC: tobacco use  HPI    [name] is a 52 y.o. male with a medical history of Afib, multifocal atrial tachycardia, tobacco abuse, and blindness of left eye presents to f/u on tobacco use. Smoking since he was [age] years old. Used to smoke a pack and a half a day. Recently decreased to a pack and a quarter per day. Tried vaping, tarter cream with little to no relief. Prescribed nicotine patch 21 mg/24 hr patch at the last visit. Used two patches but they were ineffective and continues to smoke.     Pt was also switched from Trelegy Ellipta to [facility] due to altered taste sensation with Trelegy. States he was unable to afford Breo Ellipta at [facility]. Was being charged 300+ dollars.     Was also referred to [facility] Pulmonology but patient did not have CAFA completed yet. CAFA is still in process of approval.     Requests a refill of his anti-hypertensive medications. Has not run out. Taking as directed. BP well controlled at 128/89 mmHg today. Does not endorse CP, palpitations, HA, abdominal pain, tingling, numbness, swelling, rash, or GI/GU sxs.     Outpatient Medications Prior to Visit  Medication Sig Dispense Refill  . diltiazem (CARDIZEM CD) 120 MG 24 hr capsule TAKE 1 CAPSULE BY MOUTH EVERY DAY 30 capsule 2  . fluticasone furoate-vilanterol (BREO ELLIPTA) 200-25 MCG/INH AEPB Inhale 1 puff into the lungs daily. 1 each 2  . lisinopril (PRINIVIL,ZESTRIL) 10 MG tablet Take 1 tablet (10 mg total) by mouth daily. 90 tablet 3  . nicotine (NICODERM CQ - DOSED IN MG/24 HOURS) 21 mg/24hr patch Place 1 patch (21 mg total) onto the skin daily. 28 patch 1   No facility-administered medications prior to visit.      ROS Review of Systems  Constitutional: Negative for chills, fever and malaise/fatigue.  Eyes: Negative for blurred vision.  Respiratory: Negative for shortness of breath.    Cardiovascular: Negative for chest pain and palpitations.  Gastrointestinal: Negative for abdominal pain and nausea.  Genitourinary: Negative for dysuria and hematuria.  Musculoskeletal: Negative for joint pain and myalgias.  Skin: Negative for rash.  Neurological: Negative for tingling and headaches.  Psychiatric/Behavioral: Negative for depression. The patient is not nervous/anxious.     Objective:  There were no vitals taken for this visit.  BP/Weight 07/09/2018 06/04/2018 06/03/2018  Systolic BP 128 127 -  Diastolic BP 89 81 -  Wt. (Lbs) 191.4 - 184  BMI 27.46 - -      Physical Exam  Constitutional: He is oriented to person, place, and time.  Well developed, well nourished, NAD, polite  HENT:  Head: Normocephalic and atraumatic.  Eyes: No scleral icterus.  Neck: Normal range of motion. Neck supple. No thyromegaly present.  Cardiovascular: Normal rate, regular rhythm and normal heart sounds.  No LE edema bilaterally  Pulmonary/Chest: Effort normal and breath sounds normal.  Musculoskeletal: He exhibits no edema.  Neurological: He is alert and oriented to person, place, and time.  Skin: Skin is warm and dry. No rash noted. No erythema. No pallor.  Psychiatric: He has a normal mood and affect. His behavior is normal. Thought content normal.  Vitals reviewed.    Assessment & Plan:    1. Tobacco use disorder - varenicline (CHANTIX STARTING MONTH PAK) 0.5 MG X 11 & 1 MG X 42  tablet; Take one 0.5 mg tablet by mouth once daily for 3 days, then increase to one 0.5 mg tablet twice daily for 4 days, then increase to one 1 mg tablet twice daily.  Dispense: 53 tablet; Refill: 0 - varenicline (CHANTIX CONTINUING MONTH PAK) 1 MG tablet; Take 1 tablet (1 mg total) by mouth 2 (two) times daily.  Dispense: 60 tablet; Refill: 2  2. Shortness of breath - fluticasone furoate-vilanterol (BREO ELLIPTA) 200-25 MCG/INH AEPB; Inhale 1 puff into the lungs daily.  Dispense: 60 each; Refill:  5  3. Medication refill - diltiazem (CARDIZEM CD) 120 MG 24 hr capsule; TAKE 1 CAPSULE BY MOUTH EVERY DAY  Dispense: 90 capsule; Refill: 3 - lisinopril (PRINIVIL,ZESTRIL) 10 MG tablet; Take 1 tablet (10 mg total) by mouth daily.  Dispense: 90 tablet; Refill: 3      Meds ordered this encounter  Medications  . fluticasone furoate-vilanterol (BREO ELLIPTA) 200-25 MCG/INH AEPB    Sig: Inhale 1 puff into the lungs daily.    Dispense:  60 each    Refill:  5    Order Specific Question:   Supervising Provider    Answer:   Hoy Register [[id number]]  . varenicline (CHANTIX STARTING MONTH PAK) 0.5 MG X 11 & 1 MG X 42 tablet    Sig: Take one 0.5 mg tablet by mouth once daily for 3 days, then increase to one 0.5 mg tablet twice daily for 4 days, then increase to one 1 mg tablet twice daily.    Dispense:  53 tablet    Refill:  0    Order Specific Question:   Supervising Provider    Answer:   Hoy Register [[id number]]  . varenicline (CHANTIX CONTINUING MONTH PAK) 1 MG tablet    Sig: Take 1 tablet (1 mg total) by mouth 2 (two) times daily.    Dispense:  60 tablet    Refill:  2    Order Specific Question:   Supervising Provider    Answer:   Hoy Register [[id number]]  . diltiazem (CARDIZEM CD) 120 MG 24 hr capsule    Sig: TAKE 1 CAPSULE BY MOUTH EVERY DAY    Dispense:  90 capsule    Refill:  3    Order Specific Question:   Supervising Provider    Answer:   Hoy Register [[id number]]  . lisinopril (PRINIVIL,ZESTRIL) 10 MG tablet    Sig: Take 1 tablet (10 mg total) by mouth daily.    Dispense:  90 tablet    Refill:  3    Order Specific Question:   Supervising Provider    Answer:   Hoy Register [[id number]]    Follow-up: Return in about 8 weeks (around 09/28/2018) for Tobacco use .   Loletta Specter PA

## 2018-08-03 NOTE — Patient Instructions (Signed)

## 2018-08-13 MED FILL — !BREO ELLIPTA 200-25 MCG: 200-25 | 30 days supply | Qty: 60 | Fill #0

## 2018-08-25 ENCOUNTER — Institutional Professional Consult (permissible substitution): Payer: Self-pay | Admitting: Internal Medicine

## 2018-08-27 ENCOUNTER — Ambulatory Visit (INDEPENDENT_AMBULATORY_CARE_PROVIDER_SITE_OTHER): Payer: Self-pay | Admitting: Internal Medicine

## 2018-08-27 ENCOUNTER — Encounter: Payer: Self-pay | Admitting: Internal Medicine

## 2018-08-27 VITALS — BP 124/74 | HR 68 | Ht 70.0 in | Wt 193.0 lb

## 2018-08-27 DIAGNOSIS — J449 Chronic obstructive pulmonary disease, unspecified: Secondary | ICD-10-CM

## 2018-08-27 DIAGNOSIS — I480 Paroxysmal atrial fibrillation: Secondary | ICD-10-CM

## 2018-08-27 DIAGNOSIS — F17210 Nicotine dependence, cigarettes, uncomplicated: Secondary | ICD-10-CM

## 2018-08-27 DIAGNOSIS — R9389 Abnormal findings on diagnostic imaging of other specified body structures: Secondary | ICD-10-CM

## 2018-08-27 MED ORDER — BUDESONIDE-FORMOTEROL FUMARATE 80-4.5 MCG/ACT IN AERO: 2.0000 | INHALATION_SPRAY | Freq: Two times a day (BID) | RESPIRATORY_TRACT | 11 refills | Status: DC

## 2018-08-27 MED ORDER — BUDESONIDE-FORMOTEROL FUMARATE 80-4.5 MCG/ACT IN AERO: 2.0000 | INHALATION_SPRAY | Freq: Two times a day (BID) | RESPIRATORY_TRACT | 0 refills | Status: DC

## 2018-08-27 NOTE — Progress Notes (Signed)
[name], male    DOB: 1965/11/16, 52 y.o.   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   Acute bronchitis   Tobacco use disorder    Likely undiagnosed COPD   Hospital Course: Patient is a 52 year old male with past medical history significant for tobacco abuse, likely undiagnosed COPD, blindness of the left eye. Patient was seen in an urgent care facility for likely undiagnosed COPD exacerbation and acute bronchitis. Patient was noted to be in atrial fibrillation with rapid ventricular response.   Patient was advised to come to the hospital for further assessment and management.  On presentation to the hospital, the patient was in atrial fibrillation with rapid ventricular response (heart rate of 130 bpm).  No prior history of atrial fibrillation.   No history of congestive heart failure, hypertension, coronary artery disease, diabetes mellitus, thromboembolic disease or prior strokes.  Patient was admitted for further assessment and management.  Patient was started on Cardizem drip.  D-dimer was checked and it was elevated.  However, CT of the chest was negative for pulmonary embolism.  Echocardiogram was within normal range, with normal ejection fraction.  Patient is back to normal sinus rhythm.  Vision will be discharged back home on doxycycline, Cardizem and Trelegy.   08/27/2018   Pulmonary / 1st office eval  Chief Complaint  Patient presents with  . Pulmonary Consult    Referred by Sindy Messing, PA. Pt c/o SOB for "years"- worse for the past year.  He gets winded walking from his house to his garage and also when he walks fast pace. He has cough- prod in the am's with clear sputum. He  wakes up in the night coughing-esp if lies on his left side. He is taking Breo with no relief and he does not have a rescue inhaler.   chronic  Dyspnea x > one year min progressive with baseline now  MMRC1 = can walk nl pace, flat grade, can't hurry or go uphills or steps s sob no better on BREO / goal is to tote rolls of tarp on should like he used to Cough: esp in am's x one year, worse in am's  Sleep: cough esp L side down  SABA use: none  Cp attributed to overt hb not ex x years worse attack p ate mints   No obvious day to day or daytime variability or assoc truly excess/ purulent sputum or mucus plugs or hemoptysis or  chest tightness, subjective wheeze or overt sinus   symptoms.   Sleeping as above  without nocturnal  or early am exacerbation  of respiratory  C/o's (as long as on R side)  or need for noct saba. Also denies any obvious fluctuation of symptoms with weather or environmental changes or other aggravating or alleviating factors except as outlined above   No unusual exposure hx or h/o childhood pna/ asthma or knowledge of premature birth.  Current Allergies, Complete Past Medical History, Past Surgical History, Family History, and Social History were reviewed in [employer] record.  ROS  The following are not active complaints unless bolded Hoarseness, sore throat, dysphagia, dental problems, itching, sneezing,  nasal congestion or discharge of excess mucus or  purulent secretions, ear ache,   fever, chills, sweats, unintended wt loss or wt gain, classically pleuritic or exertional cp,  orthopnea pnd or arm/hand swelling  or leg swelling, presyncope, palpitations, abdominal pain, anorexia, nausea, vomiting, diarrhea  or change in bowel habits or change in bladder habits, change in stools or change in urine, dysuria, hematuria,  rash, arthralgias, visual complaints, headache, numbness, weakness or ataxia or problems with walking or coordination,  change in mood  or  memory.           Past Medical History:  Diagnosis Date  . Atrial fibrillation with RVR (HCC) 06/02/2018  . 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 Multifocal atrial tachycardia (HCC) 06/02/2018    Outpatient Medications Prior to Visit  Medication Sig Dispense Refill  . diltiazem (CARDIZEM CD) 120 MG 24 hr capsule TAKE 1 CAPSULE BY MOUTH EVERY DAY 90 capsule 3  . fluticasone furoate-vilanterol (BREO ELLIPTA) 200-25 MCG/INH AEPB Inhale 1 puff into the lungs daily. 60 each 5  . lisinopril (PRINIVIL,ZESTRIL) 10 MG tablet Take 1 tablet (10 mg total) by mouth daily. 90 tablet 3  . varenicline (CHANTIX CONTINUING MONTH PAK) 1 MG tablet Take 1 tablet (1 mg total) by mouth 2 (two) times daily. 60 tablet 2  . varenicline (CHANTIX STARTING MONTH PAK) 0.5 MG X 11 & 1 MG X 42 tablet Take one 0.5 mg tablet by mouth once daily for 3 days, then increase to one 0.5 mg tablet twice daily for 4 days, then increase to one 1 mg tablet twice daily. 53 tablet 0             Objective:     BP 124/74 (BP Location: Left Arm, Cuff Size: Normal)   Pulse 68   Ht 5\' 10"  (1.778 m)   Wt 193 lb (87.5 kg)   SpO2 98%   BMI 27.69 kg/m   SpO2: 98 %  RA  Stoic slt hoarse amb wm nad  HEENT: Very poor  dentition / nl oropharynx. Nl external ear canals without cough reflex -  Mild bilateral non-specific turbinate edema     NECK :  without JVD/Nodes/TM/ nl carotid upstrokes bilaterally   LUNGS: no acc muscle use,  Mild barrel  contour chest wall with bilateral  Distant bs s audible wheeze and  without cough on insp or exp maneuver and mild  Hyperresonant  to  percussion bilaterally     CV:  RRR  no s3 or murmur or increase in P2, and no edema   ABD:  soft and nontender with pos late  insp Hoover's  in the supine position. No bruits or organomegaly appreciated, bowel sounds nl  MS:   Nl gait/  ext warm without deformities, calf tenderness, cyanosis or clubbing No  obvious joint restrictions   SKIN: warm and dry without lesions    NEURO:  alert, approp, nl sensorium with  no motor or cerebellar deficits apparent.         I personally reviewed images and agree with radiology impression as follows:   Chest CT  06/03/18 1. No evidence of pulmonary embolus. 2. Vague soft tissue density tracking about the right hilum and bronchioles to the right lower lobe, measuring up to 2.1 x 1.3 cm. This is of uncertain significance. This is somewhat more prominent than in 2014; malignancy cannot be entirely excluded. PET/CT would be helpful for further evaluation, when and as deemed clinically  appropriate.    Assessment   COPD GOLD 0 still smoking  Spirometry 08/27/2018  FEV1 3.0 (78%)  Ratio 80 p breo 200 s curvature - 08/27/2018  After extensive coaching inhaler device,  effectiveness =    75% try symb 80 2bid     Symptoms are   disproportionate to objective findings and not clear to what extent this is actually a pulmonary  problem but pt does appear to have difficult to sort out respiratory symptoms of unknown origin for which  DDX  = almost all start with A and  include Adherence, Ace Inhibitors, Acid Reflux, Active Sinus Disease, Alpha 1 Antitripsin deficiency, Anxiety masquerading as Airways dz,  ABPA,  Allergy(esp in young), Aspiration (esp in elderly), Adverse effects of meds,  Active smoking or Vaping, A bunch of PE's/clot burden (a few small clots can't cause this syndrome unless there is already severe underlying pulm or vascular dz with poor reserve),  Anemia or thyroid disorder, plus two Bs  = Bronchiectasis and Beta blocker use..and one C= CHF    Adherence is always the initial "prime suspect" and is a multilayered concern that requires a "trust but verify" approach in every patient - starting with knowing how to use medications, especially inhalers, correctly, keeping up with refills and understanding the fundamental difference between maintenance and  prns vs those medications only taken for a very short course and then stopped and not refilled.  - see hfa teaching - return with all meds in hand using a trust but verify approach to confirm accurate Medication  Reconciliation The principal here is that until we are certain that the  patients are doing what we've asked, it makes no sense to ask them to do more.   Active smoking > see sep a/p  ? acei effects > does not explain long term symptom as just started   ? Adverse effects of dpi > try hfa low dose as does not have significant copd = symb 80 2bid  ? Chf/ recent afib > cards eval       Paroxysmal atrial fibrillation (HCC) Appears to be back in nsr now p episode of RAF 06/02/18  Lab Results  Component Value Date   TSH 1.150 06/03/2018    >>> Referred to cardiology 08/27/2018 / continue cardizem and avoid excess B2 stimulation   Abnormal chest CT I reviewed all of his studies and the area of concern is contiguous with the R hilum and minimally changed x 5 years so very unlikely to be anything but atypical lymphatic structure   Although there are clearly abnormalities on CT scan, they should probably be considered "microscopic" since not obvious on plain cxr .     In the setting of obvious "macroscopic" health issues,  I am very reluctaant to embark on an invasive w/u at this point but will arrange consevative  follow up and in the meantime see what we can do to address the patient's subjective concerns.   Discussed in detail all the  indications, usual  risks and alternatives  relative to the benefits with patient who agrees to proceed with conservative f/u as outlined     Cigarette smoker 4-5 min discussion re active cigarette smoking in addition to office E&M  Ask about tobacco use:   ongoing Advise quitting    I reviewed the Fletcher curve with the patient and wife  that basically indicates  if you quit smoking when your best day FEV1 is still well preserved (as is  clearly   the case here)  it is highly unlikely you will progress to severe disease and informed the patient there was  no medication on the market that has proven to alter the curve/ its downward trajectory  or the likelihood of progression of their disease(unlike other chronic medical conditions such as atheroclerosis where we do think we can change the natural hx with risk reducing meds)    Therefore stopping smoking and maintaining abstinence are  the most important aspects of care, not choice of inhalers or for that matter, doctors.   Treatment other than smoking cessation  is entirely directed by severity of symptoms and focused also on reducing exacerbations, not attempting to change the natural history of the disease.   Assess willingness:  Not committed at this point Assist in quit attempt:  Per PCP when ready Arrange follow up:   Follow up per Primary Care planned       Total time devoted to counseling  > 50 % of initial 60 min office visit:  review case with pt/ discussion of options/alternatives/ personally creating written customized instructions  in presence of pt  then going over those specific  Instructions directly with the pt including how to use all of the meds but in particular covering each new medication in detail and the difference between the maintenance= "automatic" meds and the prns using an action plan format for the latter (If this problem/symptom => do that organization reading Left to right).  Please see AVS from this visit for a full list of these instructions which I personally wrote for this pt and  are unique to this visit.   See device teaching which extended face to face time for this visit          Sandrea Hughs, MD 08/27/2018

## 2018-08-27 NOTE — Patient Instructions (Addendum)
Stop Breo and stat Symbicort 80 Take 2 puffs first thing in am and then another 2 puffs about 12 hours later.     Work on inhaler technique:  relax and gently blow all the way out then take a nice smooth deep breath back in, triggering the inhaler at same time you start breathing in.  Hold for up to 5 seconds if you can. Blow out thru nose. Rinse and gargle with water when done      GERD (REFLUX)  is an extremely common cause of respiratory symptoms just like yours , many times with no obvious heartburn at all.    It can be treated with medication, but also with lifestyle changes including elevation of the head of your bed (ideally with 6 inch  bed blocks),  Smoking cessation, avoidance of late meals, excessive alcohol, and avoid fatty foods, chocolate, peppermint, colas, red wine, and acidic juices such as orange juice.  NO MINT OR MENTHOL PRODUCTS SO NO COUGH DROPS   USE SUGARLESS CANDY INSTEAD (Jolley ranchers or Stover's or Life Savers) or even ice chips will also do - the key is to swallow to prevent all throat clearing. NO OIL BASED VITAMINS - use powdered substitutes.    Please see patient coordinator before you leave today  to schedule cardiology for afib   The key is to stop smoking completely before smoking completely stops you!   Please schedule a follow up office visit in 6 weeks, call sooner if needed

## 2018-08-28 ENCOUNTER — Encounter: Payer: Self-pay | Admitting: Internal Medicine

## 2018-08-28 DIAGNOSIS — R9389 Abnormal findings on diagnostic imaging of other specified body structures: Secondary | ICD-10-CM | POA: Insufficient documentation

## 2018-08-28 NOTE — Assessment & Plan Note (Signed)
I reviewed all of his studies and the area of concern is contiguous with the R hilum and minimally changed x 5 years so very unlikely to be anything but atypical lymphatic structure   Although there are clearly abnormalities on CT scan, they should probably be considered "microscopic" since not obvious on plain cxr .     In the setting of obvious "macroscopic" health issues,  I am very reluctaant to embark on an invasive w/u at this point but will arrange consevative  follow up and in the meantime see what we can do to address the patient's subjective concerns.   Discussed in detail all the  indications, usual  risks and alternatives  relative to the benefits with patient who agrees to proceed with conservative f/u as outlined

## 2018-08-28 NOTE — Assessment & Plan Note (Signed)
Spirometry 08/27/2018  FEV1 3.0 (78%)  Ratio 80 p breo 200 s curvature - 08/27/2018  After extensive coaching inhaler device,  effectiveness =    75% try symb 80 2bid     Symptoms are   disproportionate to objective findings and not clear to what extent this is actually a pulmonary  problem but pt does appear to have difficult to sort out respiratory symptoms of unknown origin for which  DDX  = almost all start with A and  include Adherence, Ace Inhibitors, Acid Reflux, Active Sinus Disease, Alpha 1 Antitripsin deficiency, Anxiety masquerading as Airways dz,  ABPA,  Allergy(esp in young), Aspiration (esp in elderly), Adverse effects of meds,  Active smoking or Vaping, A bunch of PE's/clot burden (a few small clots can't cause this syndrome unless there is already severe underlying pulm or vascular dz with poor reserve),  Anemia or thyroid disorder, plus two Bs  = Bronchiectasis and Beta blocker use..and one C= CHF    Adherence is always the initial "prime suspect" and is a multilayered concern that requires a "trust but verify" approach in every patient - starting with knowing how to use medications, especially inhalers, correctly, keeping up with refills and understanding the fundamental difference between maintenance and prns vs those medications only taken for a very short course and then stopped and not refilled.  - see hfa teaching - return with all meds in hand using a trust but verify approach to confirm accurate Medication  Reconciliation The principal here is that until we are certain that the  patients are doing what we've asked, it makes no sense to ask them to do more.   Active smoking > see sep a/p  ? acei effects > does not explain long term symptom as just started   ? Adverse effects of dpi > try hfa low dose as does not have significant copd = symb 80 2bid  ? Chf/ recent afib > cards eval

## 2018-08-28 NOTE — Assessment & Plan Note (Addendum)
Appears to be back in nsr now p episode of RAF 06/02/18  Lab Results  Component Value Date   TSH 1.150 06/03/2018    >>> Referred to cardiology 08/27/2018 / continue cardizem and avoid excess B2 stimulation

## 2018-08-28 NOTE — Assessment & Plan Note (Signed)
4-5 min discussion re active cigarette smoking in addition to office E&M  Ask about tobacco use:   ongoing Advise quitting    I reviewed the Fletcher curve with the patient and wife  that basically indicates  if you quit smoking when your best day FEV1 is still well preserved (as is clearly  the case here)  it is highly unlikely you will progress to severe disease and informed the patient there was  no medication on the market that has proven to alter the curve/ its downward trajectory  or the likelihood of progression of their disease(unlike other chronic medical conditions such as atheroclerosis where we do think we can change the natural hx with risk reducing meds)    Therefore stopping smoking and maintaining abstinence are  the most important aspects of care, not choice of inhalers or for that matter, doctors.   Treatment other than smoking cessation  is entirely directed by severity of symptoms and focused also on reducing exacerbations, not attempting to change the natural history of the disease.   Assess willingness:  Not committed at this point Assist in quit attempt:  Per PCP when ready Arrange follow up:   Follow up per Primary Care planned       Total time devoted to counseling  > 50 % of initial 60 min office visit:  review case with pt/ discussion of options/alternatives/ personally creating written customized instructions  in presence of pt  then going over those specific  Instructions directly with the pt including how to use all of the meds but in particular covering each new medication in detail and the difference between the maintenance= "automatic" meds and the prns using an action plan format for the latter (If this problem/symptom => do that organization reading Left to right).  Please see AVS from this visit for a full list of these instructions which I personally wrote for this pt and  are unique to this visit.   See device teaching which extended face to face time for this  visit

## 2018-09-28 ENCOUNTER — Ambulatory Visit (INDEPENDENT_AMBULATORY_CARE_PROVIDER_SITE_OTHER): Payer: Self-pay | Admitting: Physician Assistant

## 2018-10-15 ENCOUNTER — Ambulatory Visit (INDEPENDENT_AMBULATORY_CARE_PROVIDER_SITE_OTHER): Payer: Self-pay | Admitting: Physician Assistant

## 2018-10-16 ENCOUNTER — Ambulatory Visit: Payer: Self-pay | Admitting: Internal Medicine

## 2018-10-30 ENCOUNTER — Ambulatory Visit (INDEPENDENT_AMBULATORY_CARE_PROVIDER_SITE_OTHER): Payer: Self-pay | Admitting: Internal Medicine

## 2018-10-30 ENCOUNTER — Encounter: Payer: Self-pay | Admitting: Internal Medicine

## 2018-10-30 DIAGNOSIS — I10 Essential (primary) hypertension: Secondary | ICD-10-CM

## 2018-10-30 DIAGNOSIS — R911 Solitary pulmonary nodule: Secondary | ICD-10-CM

## 2018-10-30 DIAGNOSIS — R9389 Abnormal findings on diagnostic imaging of other specified body structures: Secondary | ICD-10-CM

## 2018-10-30 DIAGNOSIS — F17210 Nicotine dependence, cigarettes, uncomplicated: Secondary | ICD-10-CM

## 2018-10-30 DIAGNOSIS — J449 Chronic obstructive pulmonary disease, unspecified: Secondary | ICD-10-CM

## 2018-10-30 MED ORDER — VALSARTAN 160 MG PO TABS: 160.0000 mg | ORAL_TABLET | Freq: Every day | ORAL | 11 refills | Status: DC

## 2018-10-30 NOTE — Progress Notes (Signed)
[name], male    DOB: 12-09-1965, 53 y.o.   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   Acute bronchitis   Tobacco use disorder    Likely undiagnosed COPD   Hospital Course: Patient is a 53 year old male with past medical history significant for tobacco abuse, likely undiagnosed COPD, blindness of the left eye. Patient was seen in an urgent care facility for likely undiagnosed COPD exacerbation and acute bronchitis. Patient was noted to be in atrial fibrillation with rapid ventricular response.   Patient was advised to come to the hospital for further assessment and management.  On presentation to the hospital, the patient was in atrial fibrillation with rapid ventricular response (heart rate of 130 bpm).  No prior history of atrial fibrillation.   No history of congestive heart failure, hypertension, coronary artery disease, diabetes mellitus, thromboembolic disease or prior strokes.  Patient was admitted for further assessment and management.  Patient was started on Cardizem drip.  D-dimer was checked and it was elevated.  However, CT of the chest was negative for pulmonary embolism.  Echocardiogram was within normal range, with normal ejection fraction.  Patient is back to normal sinus rhythm.  Vision will be discharged back home on doxycycline, Cardizem and Trelegy.   08/27/2018   Pulmonary / 1st office eval  Chief Complaint  Patient presents with  . Pulmonary Consult    Referred by Sindy Messing, PA. Pt c/o SOB for "years"- worse for the past year.  He gets winded walking from his house to his garage and also when he walks fast pace. He has cough- prod in the am's with clear sputum. He  wakes up in the night coughing-esp if lies on his left side. He is taking Breo with no relief and he does not have a rescue inhaler.   chronic  Dyspnea x > one year min progressive with baseline now  MMRC1 = can walk nl pace, flat grade, can't hurry or go uphills or steps s sob no better on BREO / goal is to tote rolls of tarp on should like he used to Cough: esp in am's x one year  Sleep: cough esp L side down  SABA use: none  Cp attributed to overt hb not ex x years worse attack p ate mints rec Stop Breo and stat Symbicort 80 Take 2 puffs first thing in am and then another 2 puffs about 12 hours later.  Work on inhaler technique:   GERD  Please see patient coordinator before you leave today  to schedule cardiology for afib  Please schedule a follow up office visit in 6 weeks, call sooner if needed    10/30/2018  f/u ov/[name] re: copd GOLD 0/  still smoking  Chief Complaint  Patient presents with  . Follow-up    Breathing is unchanged.   Dyspnea:  MMRC1 = can walk nl pace, flat grade, can't hurry or go uphills or steps s sob  Or carrying heavy objects Cough: at hs assoc with sense of nasal congestion and pnds on acei  Sleeping: on side  SABA use: off everything  02: none    No obvious day to day or daytime variability or assoc  excess/ purulent sputum or mucus plugs or hemoptysis or cp or chest tightness, subjective wheeze or overt  hb symptoms.     Also denies any obvious fluctuation of symptoms with weather or environmental changes or other aggravating or alleviating factors except as outlined above   No unusual exposure hx or h/o childhood pna/ asthma or knowledge of premature birth.  Current Allergies, Complete Past Medical History, Past Surgical History, Family History, and Social History were reviewed in [facility] electronic medical record.  ROS  The following are not active complaints unless bolded Hoarseness, sore throat, dysphagia, dental problems, itching, sneezing,   nasal congestion or discharge of excess mucus or purulent secretions, ear ache,   fever, chills, sweats, unintended wt loss or wt gain, classically pleuritic or exertional cp,  orthopnea pnd or arm/hand swelling  or leg swelling, presyncope, palpitations, abdominal pain, anorexia, nausea, vomiting, diarrhea  or change in bowel habits or change in bladder habits, change in stools or change in urine, dysuria, hematuria,  rash, arthralgias, visual complaints, headache, numbness, weakness or ataxia or problems with walking or coordination,  change in mood or  memory.        Current Meds  Medication Sig  . budesonide-formoterol (SYMBICORT) 80-4.5 MCG/ACT inhaler Inhale 2 puffs into the lungs 2 (two) times daily.  Marland Kitchen. diltiazem (CARDIZEM CD) 120 MG 24 hr capsule TAKE 1 CAPSULE BY MOUTH EVERY DAY  . [ ]  lisinopril (PRINIVIL,ZESTRIL) 10 MG tablet Take 1 tablet (10 mg total) by mouth daily.                     Objective:    Hoarse amb wm nad  Wt Readings from Last 3 Encounters:  10/30/18 191 lb 3.2 oz (86.7 kg)  08/27/18 193 lb (87.5 kg)  08/03/18 193 lb (87.5 kg)     Vital signs reviewed - Note on arrival 02 sats  99% on RA        HEENT: nl dentition / oropharynx. Nl external ear canals without cough reflex -  Mild bilateral non-specific turbinate edema     NECK :  without JVD/Nodes/TM/ nl carotid upstrokes bilaterally   LUNGS: no acc muscle use,  Min barrel  contour chest wall with bilateral min insp/exp rhonchi and  without cough on insp or exp maneuver and min  Hyperresonant  to  percussion bilaterally     CV:  RRR  no s3 or murmur or increase in P2, and no edema   ABD:  soft and nontender with pos late  insp Hoover's  in the supine position. No bruits or organomegaly appreciated, bowel sounds nl  MS:   Nl gait/  ext warm without deformities, calf tenderness, cyanosis or clubbing No obvious joint restrictions   SKIN: warm and dry without lesions    NEURO:  alert, approp, nl  sensorium with  no motor or cerebellar deficits apparent.              Assessment

## 2018-10-30 NOTE — Patient Instructions (Addendum)
Stop prinivil and the smoking completely   For cough mucinex dm 1200 mg every 12hours as needed  Diovan 160 mg one daily - let me know if you can't get it   CT with contrast in 12/23/17 and see me afterwards for office visit

## 2018-10-31 ENCOUNTER — Encounter: Payer: Self-pay | Admitting: Internal Medicine

## 2018-10-31 DIAGNOSIS — I10 Essential (primary) hypertension: Secondary | ICD-10-CM | POA: Insufficient documentation

## 2018-10-31 NOTE — Assessment & Plan Note (Signed)
Counseled re importance of smoking cessation but did not meet time criteria for separate billing   °

## 2018-10-31 NOTE — Assessment & Plan Note (Signed)
CT chest 06/03/18 Vague soft tissue density tracking about the right hilum and bronchioles to the right lower lobe, measuring up to 2.1 x 1.3 cm. This is of uncertain significance. This is somewhat more prominent than in 2014; malignancy cannot be entirely excluded.   - CT chest with contrast scheduled for 12/23/17   Present x 5 years? Etiology > repeat @ 6 months due as above  Discussed in detail all the  indications, usual  risks and alternatives  relative to the benefits with patient who agrees to proceed with  f/u as outlined

## 2018-10-31 NOTE — Assessment & Plan Note (Signed)
D/c 10/30/2018 due to cough related to sense of pnds   In the best review of chronic cough to date ( NEJM 2016 375 [id number]) ,  ACEi are now felt to cause cough in up to  20% of pts which is a 4 fold increase from previous reports and does not include the variety of non-specific complaints we see in pulmonary clinic in pts on ACEi but previously attributed to another dx like  Copd/asthma and  include PNDS, throat and chest congestion, "bronchitis", unexplained dyspnea and noct "strangling" sensations, and hoarseness, but also  atypical /refractory GERD symptoms like dysphagia and "bad heartburn"   The only way I know  to prove this is not an "ACEi Case" is a trial off ACEi x a minimum of 6 weeks then regroup.   >>>> Try diovan 160 mg daily instead      I had an extended discussion with the patient/wife [name]  reviewing all relevant studies completed to date and  lasting 15 to 20 minutes of a 25 minute visit    See device teaching which extended face to face time for this visit as did smoking counseling.  Each maintenance medication was reviewed in detail including emphasizing most importantly the difference between maintenance and prns and under what circumstances the prns are to be triggered using an action plan format that is not reflected in the computer generated alphabetically organized AVS which I have not found useful in most complex patients, especially with respiratory illnesses  Please see AVS for specific instructions unique to this visit that I personally wrote and verbalized to the the pt in detail and then reviewed with pt  by my nurse highlighting any  changes in therapy recommended at today's visit to their plan of care.

## 2018-10-31 NOTE — Assessment & Plan Note (Signed)
Spirometry 08/27/2018  FEV1 3.0 (78%)  Ratio 80 p breo 200 s curvature - 08/27/2018    try symb 80 2bid - 10/30/2018  After extensive coaching inhaler device,  effectiveness =    90%   Continue symb 80 2bid

## 2018-11-16 ENCOUNTER — Ambulatory Visit (INDEPENDENT_AMBULATORY_CARE_PROVIDER_SITE_OTHER): Payer: Self-pay | Admitting: Internal Medicine

## 2018-11-19 ENCOUNTER — Ambulatory Visit: Payer: Self-pay | Attending: Family Medicine

## 2018-11-26 ENCOUNTER — Ambulatory Visit (INDEPENDENT_AMBULATORY_CARE_PROVIDER_SITE_OTHER): Payer: Self-pay | Admitting: Family Medicine

## 2018-12-23 ENCOUNTER — Ambulatory Visit: Payer: Self-pay | Admitting: Cardiovascular Disease

## 2018-12-23 ENCOUNTER — Ambulatory Visit: Payer: Self-pay | Admitting: Internal Medicine

## 2018-12-23 ENCOUNTER — Inpatient Hospital Stay: Admission: RE | Admit: 2018-12-23 | Payer: Self-pay | Source: Ambulatory Visit

## 2019-01-13 ENCOUNTER — Telehealth: Payer: Self-pay

## 2019-01-13 NOTE — Telephone Encounter (Signed)
Spoke with the patient, he expressed understanding about delaying his appointment for a month. He stated that he is SOB on exertion and he has some lightheadedness. He stated that he was fine to wait. I advised if he worsens to contact our office.

## 2019-01-14 ENCOUNTER — Ambulatory Visit (INDEPENDENT_AMBULATORY_CARE_PROVIDER_SITE_OTHER)
Admission: RE | Admit: 2019-01-14 | Discharge: 2019-01-14 | Disposition: A | Discharge status: Home / Self Care | Payer: Self-pay | Source: Ambulatory Visit | Attending: Internal Medicine | Admitting: Internal Medicine

## 2019-01-14 ENCOUNTER — Ambulatory Visit: Payer: Self-pay | Admitting: Cardiology

## 2019-01-14 ENCOUNTER — Other Ambulatory Visit: Payer: Self-pay

## 2019-01-14 DIAGNOSIS — R911 Solitary pulmonary nodule: Secondary | ICD-10-CM

## 2019-01-14 DIAGNOSIS — R9389 Abnormal findings on diagnostic imaging of other specified body structures: Secondary | ICD-10-CM

## 2019-01-14 MED ORDER — IOHEXOL 300 MG/ML  SOLN
80.0000 mL | Freq: Once | INTRAMUSCULAR | Status: AC | PRN
  Administered 2019-01-14: 80 mL via INTRAVENOUS

## 2019-01-15 NOTE — Progress Notes (Signed)
Spoke with pt and notified of results per Dr. Wert. Pt verbalized understanding and denied any questions. 

## 2019-02-15 NOTE — Telephone Encounter (Signed)
Left message to call back  

## 2019-02-16 NOTE — Telephone Encounter (Signed)
Spoke with the patient, he was fine waiting until June.

## 2019-03-29 DIAGNOSIS — F1910 Other psychoactive substance abuse, uncomplicated: Secondary | ICD-10-CM | POA: Insufficient documentation

## 2019-03-29 NOTE — Progress Notes (Signed)
This encounter was created in error - please disregard.

## 2019-03-30 ENCOUNTER — Other Ambulatory Visit: Payer: Self-pay

## 2019-03-30 ENCOUNTER — Ambulatory Visit: Payer: Self-pay | Admitting: Cardiology

## 2019-03-30 ENCOUNTER — Encounter: Payer: Self-pay | Admitting: Cardiology

## 2019-04-15 ENCOUNTER — Telehealth: Payer: Self-pay | Admitting: Cardiology

## 2019-04-15 NOTE — Telephone Encounter (Signed)
New Message    Pt says he is returning a call for Dr Marlou Porch   Please call

## 2019-04-16 ENCOUNTER — Telehealth (INDEPENDENT_AMBULATORY_CARE_PROVIDER_SITE_OTHER): Payer: Self-pay | Admitting: Cardiology

## 2019-04-16 ENCOUNTER — Other Ambulatory Visit: Payer: Self-pay

## 2019-04-16 ENCOUNTER — Encounter: Payer: Self-pay | Admitting: Cardiology

## 2019-04-16 VITALS — Ht 70.0 in | Wt 198.0 lb

## 2019-04-16 DIAGNOSIS — I480 Paroxysmal atrial fibrillation: Secondary | ICD-10-CM

## 2019-04-16 DIAGNOSIS — F17210 Nicotine dependence, cigarettes, uncomplicated: Secondary | ICD-10-CM

## 2019-04-16 DIAGNOSIS — I10 Essential (primary) hypertension: Secondary | ICD-10-CM

## 2019-04-16 NOTE — Patient Instructions (Signed)
Medication Instructions:  The current medical regimen is effective;  continue present plan and medications.  If you need a refill on your cardiac medications before your next appointment, please call your pharmacy.   Follow-Up: Follow up as needed with Dr Skains.  Thank you for choosing [facility]!!      

## 2019-04-16 NOTE — Progress Notes (Signed)
Virtual Visit via Telephone Note   This visit type was conducted due to national recommendations for restrictions regarding the COVID-19 Pandemic (e.g. social distancing) in an effort to limit this patient's exposure and mitigate transmission in our community.  Due to his co-morbid illnesses, this patient is at least at moderate risk for complications without adequate follow up.  This format is felt to be most appropriate for this patient at this time.  The patient did not have access to video technology/had technical difficulties with video requiring transitioning to audio format only (telephone).  All issues noted in this document were discussed and addressed.  No physical exam could be performed with this format.  Please refer to the patient's chart for his  consent to telehealth for [facility].   Date:  04/16/2019   ID:  [name], DOB 01/06/1966, MRN [id number]  Patient Location: Home Provider Location: Home  PCP:  Joel SpecterGomez, Roger David, PA-C  Cardiologist:  Joel SchultzMark Skains, MD  Electrophysiologist:  None   Evaluation Performed:  Consultation - [name] was referred by Joel Messingoger Gomez, PA for the evaluation of atrial fibrillation.  Chief Complaint: Evaluation of atrial fibrillation at the request of Joel MessingRoger Gomez, PA  History of Present Illness:    [name] is a 53 y.o. male with prior history of atrial fibrillation, multifocal atrial tachycardia tobacco use COPD with blindness in his left eye.  Back on 06/03/2018 he was short of breath and went to urgent care and was found to be diagnosed with atrial fibrillation.  Echocardiogram was performed that showed normal EF.  In the emergency room with Cardizem drip he returned to normal sinus rhythm.  Discharged on 06/04/2018.  No palpitations felt.  Fatigue may be secondary to Cardizem. Felt tired with AFIB. 3pm tired.   Currently he has been feeling quite well.  Every now and then he may see "black spots "which he thinks may be because of  blood pressure or perhaps low blood sugar.  He is unsure.  1 of his family members does have a blood pressure cuff and he will try to use it to see if he is hypotensive.  Overall no syncope bleeding orthopnea PND.  Nondiabetic no early family history of coronary artery disease,  hypertension.  The patient does not have symptoms concerning for COVID-19 infection (fever, chills, cough, or new shortness of breath).    Past Medical History:  Diagnosis Date  . Atrial fibrillation with RVR (HCC) 06/02/2018  . 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 EYE SURGERY    . INGUINAL HERNIA REPAIR Right 1969  . RETINAL DETACHMENT SURGERY Left ~ 1974   "blind since"     Current Meds  Medication Sig  . diltiazem (CARDIZEM CD) 120 MG 24 hr capsule TAKE 1 CAPSULE BY MOUTH EVERY DAY  . lisinopril (ZESTRIL) 10 MG tablet Take 10 mg by mouth daily.     Allergies:   Patient has no known allergies.   Social History   Tobacco Use  . Smoking status: Current Every Day Smoker    Packs/day: 1.50    Years: 43.00    Pack years: 64.50    Types: Cigarettes  . Smokeless tobacco: Never Used  Substance Use Topics  . Alcohol use: Not Currently    Comment: 06/03/2018 "nothing in years"  .  Drug use: Not Currently    Comment: 06/03/2018 "nothing in years"     Family Hx: The patient's family history includes Healthy in his father.  ROS:   Please see the history of present illness.     All other systems reviewed and are negative.   Prior CV studies:   The following studies were reviewed today:  ECHO 06/04/18: - Left ventricle: The cavity size was normal. Wall thickness was   normal. Systolic function was normal. The estimated ejection   fraction was in the range of 60% to 65%. Wall motion was normal;   there were no regional wall motion abnormalities. Left   ventricular  diastolic function parameters were normal. - Mitral valve: Structurally normal valve. There was no   regurgitation. - Left atrium: The atrium was normal in size. - Right atrium: The atrium was normal in size. - Inferior vena cava: The vessel was normal in size. The   respirophasic diameter changes were in the normal range (>= 50%),   consistent with normal central venous pressure.  Impressions:  - Normal study.  Labs/Other Tests and Data Reviewed:    EKG:  Prior EKG in system reviewed shows atrial fibrillation with rapid ventricular response heart rate in the 120s  Recent Labs: 06/02/2018: ALT 87; Magnesium 1.9 06/03/2018: BUN 12; Creatinine, Ser 1.08; Hemoglobin 14.5; Platelets 193; Potassium 4.2; Sodium 136; TSH 1.150   Recent Lipid Panel No results found for: CHOL, TRIG, HDL, CHOLHDL, LDLCALC, LDLDIRECT  Wt Readings from Last 3 Encounters:  04/16/19 198 lb (89.8 kg)  10/30/18 191 lb 3.2 oz (86.7 kg)  08/27/18 193 lb (87.5 kg)     Objective:    Vital Signs:  Ht 5\' 10"  (1.778 m)   Wt 198 lb (89.8 kg)   BMI 28.41 kg/m    VITAL SIGNS:  reviewed pleasant, alert, able to complete full sentences without difficulty  ASSESSMENT & PLAN:     Paroxysmal atrial fibrillation  -Has not seen to have any further recurrence since being on the diltiazem.  -CHADSVASc 1 perhaps secondary to hypertension.  Based upon the score, does not warrant anticoagulation.  Coronary artery calcification  - seen on CT scan 01/14/19  -Would recommend statin therapy.  Aspirin as well.  COPD  -Dr. Melvyn Novas has seen.  -Tobacco cessation.  -It seems like Dr. Melvyn Novas tried to switch him over to ARB but he is back on lisinopril.  Cigarette smoking  -Encourage cessation.  Please let us know if we can be of further assistance.  COVID-19 Education: The signs and symptoms of COVID-19 were discussed with the patient and how to seek care for testing (follow up with PCP or arrange E-visit).  The importance of  social distancing was discussed today.  Time:   Today, I have spent 20 minutes with the patient with telehealth technology discussing the above problems.     Medication Adjustments/Labs and Tests Ordered: Current medicines are reviewed at length with the patient today.  Concerns regarding medicines are outlined above.   Tests Ordered: No orders of the defined types were placed in this encounter.   Medication Changes: No orders of the defined types were placed in this encounter.   Follow Up:  Virtual Visit or In Person prn  Signed, Candee Furbish, MD  04/16/2019 2:00 PM    [place]

## 2019-05-10 ENCOUNTER — Telehealth: Payer: Self-pay | Admitting: Cardiology

## 2019-05-10 NOTE — Telephone Encounter (Signed)
New Message  Pt c/o of Chest Pain: STAT if CP now or developed within 24 hours  1. Are you having CP right now? Yes  2. Are you experiencing any other symptoms (ex. SOB, nausea, vomiting, sweating)? Sharp pain on right side and rapid heartbeat.  3. How long have you been experiencing CP? This morning  4. Is your CP continuous or coming and going? Continuous  5. Have you taken Nitroglycerin? No ?

## 2019-05-10 NOTE — Telephone Encounter (Signed)
Returned call to Pt.  Appt made with afib clinic for 05/10/2019 at 9:30 am.  Parking code given.  Pt indicates understanding.  Thanked nurse for call.

## 2019-05-10 NOTE — Telephone Encounter (Signed)
Returned call to Pt.  Pt admits to being anxious.  Has felt his heart racing on and off for a few days.   States he does have chest pressure.    He is working in a hot environment which seemed to trigger the pressure and increased heart rate.    Was diagnosed with new onset afib 05/2018.  Advised would like to get Pt to see afib clinic for continued management of his afib.  Will see if there is any availability.

## 2019-05-11 ENCOUNTER — Encounter (HOSPITAL_COMMUNITY): Payer: Self-pay | Admitting: Nurse Practitioner

## 2019-05-11 ENCOUNTER — Other Ambulatory Visit: Payer: Self-pay

## 2019-05-11 ENCOUNTER — Ambulatory Visit (HOSPITAL_COMMUNITY)
Admission: RE | Admit: 2019-05-11 | Discharge: 2019-05-11 | Disposition: A | Discharge status: Home / Self Care | Payer: Self-pay | Source: Ambulatory Visit | Attending: Nurse Practitioner | Admitting: Nurse Practitioner

## 2019-05-11 VITALS — BP 132/60 | HR 77 | Ht 70.0 in | Wt 189.4 lb

## 2019-05-11 DIAGNOSIS — R002 Palpitations: Secondary | ICD-10-CM | POA: Insufficient documentation

## 2019-05-11 DIAGNOSIS — Z79899 Other long term (current) drug therapy: Secondary | ICD-10-CM | POA: Insufficient documentation

## 2019-05-11 DIAGNOSIS — J449 Chronic obstructive pulmonary disease, unspecified: Secondary | ICD-10-CM | POA: Insufficient documentation

## 2019-05-11 DIAGNOSIS — F17210 Nicotine dependence, cigarettes, uncomplicated: Secondary | ICD-10-CM | POA: Insufficient documentation

## 2019-05-11 DIAGNOSIS — I480 Paroxysmal atrial fibrillation: Secondary | ICD-10-CM

## 2019-05-11 DIAGNOSIS — H5462 Unqualified visual loss, left eye, normal vision right eye: Secondary | ICD-10-CM | POA: Insufficient documentation
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
## 2019-05-17 ENCOUNTER — Ambulatory Visit: Payer: Self-pay | Admitting: Cardiovascular Disease

## 2019-07-09 ENCOUNTER — Other Ambulatory Visit (HOSPITAL_COMMUNITY): Payer: Self-pay | Admitting: *Deleted

## 2019-07-09 DIAGNOSIS — Z760 Encounter for issue of repeat prescription: Secondary | ICD-10-CM

## 2019-07-09 MED ORDER — DILTIAZEM HCL ER COATED BEADS 120 MG PO CP24: ORAL_CAPSULE | ORAL | 3 refills | Status: DC

## 2019-07-09 MED ORDER — DILTIAZEM HCL 30 MG PO TABS: ORAL_TABLET | ORAL | 1 refills | Status: DC

## 2019-09-16 ENCOUNTER — Encounter (INDEPENDENT_AMBULATORY_CARE_PROVIDER_SITE_OTHER): Payer: Self-pay | Admitting: Primary Care

## 2019-09-16 ENCOUNTER — Ambulatory Visit (INDEPENDENT_AMBULATORY_CARE_PROVIDER_SITE_OTHER): Payer: Self-pay | Admitting: Primary Care

## 2019-09-16 ENCOUNTER — Other Ambulatory Visit: Payer: Self-pay

## 2019-09-16 VITALS — BP 141/90 | HR 74 | Temp 97.0°F | Ht 70.0 in | Wt 196.0 lb

## 2019-09-16 DIAGNOSIS — F17210 Nicotine dependence, cigarettes, uncomplicated: Secondary | ICD-10-CM

## 2019-09-16 DIAGNOSIS — F17200 Nicotine dependence, unspecified, uncomplicated: Secondary | ICD-10-CM

## 2019-09-16 DIAGNOSIS — J309 Allergic rhinitis, unspecified: Secondary | ICD-10-CM

## 2019-09-16 DIAGNOSIS — I10 Essential (primary) hypertension: Secondary | ICD-10-CM

## 2019-09-16 MED ORDER — LISINOPRIL 20 MG PO TABS: 20.0000 mg | ORAL_TABLET | Freq: Every day | ORAL | 3 refills | Status: DC

## 2019-09-16 NOTE — Patient Instructions (Signed)
Managing Your Hypertension Hypertension is commonly called high blood pressure. This is when the force of your blood pressing against the walls of your arteries is too strong. Arteries are blood vessels that carry blood from your heart throughout your body. Hypertension forces the heart to work harder to pump blood, and may cause the arteries to become narrow or stiff. Having untreated or uncontrolled hypertension can cause heart attack, stroke, kidney disease, and other problems. What are blood pressure readings? A blood pressure reading consists of a higher number over a lower number. Ideally, your blood pressure should be below 120/80. The first ("top") number is called the systolic pressure. It is a measure of the pressure in your arteries as your heart beats. The second ("bottom") number is called the diastolic pressure. It is a measure of the pressure in your arteries as the heart relaxes. What does my blood pressure reading mean? Blood pressure is classified into four stages. Based on your blood pressure reading, your health care provider may use the following stages to determine what type of treatment you need, if any. Systolic pressure and diastolic pressure are measured in a unit called mm Hg. Normal  Systolic pressure: below 120.  Diastolic pressure: below 80. Elevated  Systolic pressure: 120-129.  Diastolic pressure: below 80. Hypertension stage 1  Systolic pressure: 130-139.  Diastolic pressure: 80-89. Hypertension stage 2  Systolic pressure: 140 or above.  Diastolic pressure: 90 or above. What health risks are associated with hypertension? Managing your hypertension is an important responsibility. Uncontrolled hypertension can lead to:  A heart attack.  A stroke.  A weakened blood vessel (aneurysm).  Heart failure.  Kidney damage.  Eye damage.  Metabolic syndrome.  Memory and concentration problems. What changes can I make to manage my  hypertension? Hypertension can be managed by making lifestyle changes and possibly by taking medicines. Your health care provider will help you make a plan to bring your blood pressure within a normal range. Eating and drinking   Eat a diet that is high in fiber and potassium, and low in salt (sodium), added sugar, and fat. An example eating plan is called the DASH (Dietary Approaches to Stop Hypertension) diet. To eat this way: ? Eat plenty of fresh fruits and vegetables. Try to fill half of your plate at each meal with fruits and vegetables. ? Eat whole grains, such as whole wheat pasta, brown rice, or whole grain bread. Fill about one quarter of your plate with whole grains. ? Eat low-fat diary products. ? Avoid fatty cuts of meat, processed or cured meats, and poultry with skin. Fill about one quarter of your plate with lean proteins such as fish, chicken without skin, beans, eggs, and tofu. ? Avoid premade and processed foods. These tend to be higher in sodium, added sugar, and fat.  Reduce your daily sodium intake. Most people with hypertension should eat less than 1,500 mg of sodium a day.  Limit alcohol intake to no more than 1 drink a day for nonpregnant women and 2 drinks a day for men. One drink equals 12 oz of beer, 5 oz of wine, or 1 oz of hard liquor. Lifestyle  Work with your health care provider to maintain a healthy body weight, or to lose weight. Ask what an ideal weight is for you.  Get at least 30 minutes of exercise that causes your heart to beat faster (aerobic exercise) most days of the week. Activities may include walking, swimming, or biking.  Include exercise   to strengthen your muscles (resistance exercise), such as weight lifting, as part of your weekly exercise routine. Try to do these types of exercises for 30 minutes at least 3 days a week.  Do not use any products that contain nicotine or tobacco, such as cigarettes and e-cigarettes. If you need help quitting,  ask your health care provider.  Control any long-term (chronic) conditions you have, such as high cholesterol or diabetes. Monitoring  Monitor your blood pressure at home as told by your health care provider. Your personal target blood pressure may vary depending on your medical conditions, your age, and other factors.  Have your blood pressure checked regularly, as often as told by your health care provider. Working with your health care provider  Review all the medicines you take with your health care provider because there may be side effects or interactions.  Talk with your health care provider about your diet, exercise habits, and other lifestyle factors that may be contributing to hypertension.  Visit your health care provider regularly. Your health care provider can help you create and adjust your plan for managing hypertension. Will I need medicine to control my blood pressure? Your health care provider may prescribe medicine if lifestyle changes are not enough to get your blood pressure under control, and if:  Your systolic blood pressure is 130 or higher.  Your diastolic blood pressure is 80 or higher. Take medicines only as told by your health care provider. Follow the directions carefully. Blood pressure medicines must be taken as prescribed. The medicine does not work as well when you skip doses. Skipping doses also puts you at risk for problems. Contact a health care provider if:  You think you are having a reaction to medicines you have taken.  You have repeated (recurrent) headaches.  You feel dizzy.  You have swelling in your ankles.  You have trouble with your vision. Get help right away if:  You develop a severe headache or confusion.  You have unusual weakness or numbness, or you feel faint.  You have severe pain in your chest or abdomen.  You vomit repeatedly.  You have trouble breathing. Summary  Hypertension is when the force of blood pumping  through your arteries is too strong. If this condition is not controlled, it may put you at risk for serious complications.  Your personal target blood pressure may vary depending on your medical conditions, your age, and other factors. For most people, a normal blood pressure is less than 120/80.  Hypertension is managed by lifestyle changes, medicines, or both. Lifestyle changes include weight loss, eating a healthy, low-sodium diet, exercising more, and limiting alcohol. This information is not intended to replace advice given to you by your health care provider. Make sure you discuss any questions you have with your health care provider. Document Released: 07/08/2012 Document Revised: 02/05/2019 Document Reviewed: 09/11/2016 Elsevier Patient Education  2020 Elsevier Inc.  

## 2019-09-16 NOTE — Progress Notes (Signed)
Established Patient Office Visit  Subjective:  Patient ID: [name], male    DOB: 1966/05/03  Age: 53 y.o. MRN: [id number]  CC:  Chief Complaint  Patient presents with  . sinus drainge    on right side     HPI [name] presents for for facial pain referring to left side of the face feels like it is congested and hurts.  Patient's last visit was January 2019.  Presents with a elevated blood pressure 141/90 is followed by cardiology. Past Medical History:  Diagnosis Date  . Atrial fibrillation with RVR ([facility]) 06/02/2018  . 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 EYE SURGERY    . INGUINAL HERNIA REPAIR Right 1969  . RETINAL DETACHMENT SURGERY Left ~ 1974   "blind since"    Family History  Problem Relation Age of Onset  . Healthy Father     Social History   Socioeconomic History  . Marital status: Married    Spouse name: Not on file  . Number of children: Not on file  . Years of education: Not on file  . Highest education level: Not on file  Occupational History  . Not on file  Social Needs  . Financial resource strain: Not on file  . Food insecurity    Worry: Not on file    Inability: Not on file  . Transportation needs    Medical: Not on file    Non-medical: Not on file  Tobacco Use  . Smoking status: Current Every Day Smoker    Packs/day: 1.50    Years: 43.00    Pack years: 64.50    Types: Cigarettes  . Smokeless tobacco: Never Used  Substance and Sexual Activity  . Alcohol use: Not Currently    Comment: 06/03/2018 "nothing in years"  . Drug use: Not Currently    Comment: 06/03/2018 "nothing in years"  . Sexual activity: Yes  Lifestyle  . Physical activity    Days per week: Not on file    Minutes per session: Not on file  . Stress: Not on file  Relationships  . Social Musician on phone: Not on  file    Gets together: Not on file    Attends religious service: Not on file    Active member of club or organization: Not on file    Attends meetings of clubs or organizations: Not on file    Relationship status: Not on file  . Intimate partner violence    Fear of current or ex partner: Not on file    Emotionally abused: Not on file    Physically abused: Not on file    Forced sexual activity: Not on file  Other Topics Concern  . Not on file  Social History Narrative  . Not on file    Outpatient Medications Prior to Visit  Medication Sig Dispense Refill  . diltiazem (CARDIZEM CD) 120 MG 24 hr capsule TAKE 1 CAPSULE BY MOUTH EVERY DAY 90 capsule 3  . lisinopril (ZESTRIL) 10 MG tablet Take 10 mg by mouth daily.    Marland Kitchen diltiazem (CARDIZEM) 30 MG tablet Take 1 tablet every 4 hours AS NEEDED for AFIB heart rate >100 45 tablet 1   No facility-administered medications prior to visit.     No Known Allergies  ROS Review of Systems  HENT: Positive for facial swelling and sinus pain.   Respiratory: Positive for cough.   Neurological: Positive for headaches.      Objective:    Physical Exam  Constitutional: He is oriented to person, place, and time. He appears well-developed and well-nourished.  HENT:  Head: Normocephalic.  Eyes: EOM are normal.  Neck: Neck supple.  Cardiovascular: Normal rate and regular rhythm.  Pulmonary/Chest: Effort normal. He has wheezes.  Abdominal: Soft. Bowel sounds are normal.  Musculoskeletal: Normal range of motion.  Neurological: He is oriented to person, place, and time.  Skin: Skin is warm.  Psychiatric: He has a normal mood and affect. His behavior is normal.    BP (!) 141/90 (BP Location: Right Arm, Patient Position: Sitting, Cuff Size: Normal)   Pulse 74   Temp (!) 97 F (36.1 C) (Temporal)   Ht 5\' 10"  (1.778 m)   Wt 196 lb (88.9 kg)   SpO2 95%   BMI 28.12 kg/m  Wt Readings from Last 3 Encounters:  09/16/19 196 lb (88.9 kg)   05/11/19 189 lb 6.4 oz (85.9 kg)  04/16/19 198 lb (89.8 kg)     Health Maintenance Due  Topic Date Due  . Fecal DNA (Cologuard)  08/25/2014    There are no preventive care reminders to display for this patient.  Lab Results  Component Value Date   TSH 1.150 06/03/2018   Lab Results  Component Value Date   WBC 15.3 (H) 06/03/2018   HGB 14.5 06/03/2018   HCT 45.6 06/03/2018   MCV 85.2 06/03/2018   PLT 193 06/03/2018   Lab Results  Component Value Date   NA 136 06/03/2018   K 4.2 06/03/2018   CO2 24 06/03/2018   GLUCOSE 206 (H) 06/03/2018   BUN 12 06/03/2018   CREATININE 1.08 06/03/2018   BILITOT 0.5 06/02/2018   ALKPHOS 63 06/02/2018   AST 54 (H) 06/02/2018   ALT 87 (H) 06/02/2018   PROT 9.0 (H) 06/02/2018   ALBUMIN 4.0 06/02/2018   CALCIUM 9.2 06/03/2018   ANIONGAP 12 06/03/2018   No results found for: CHOL No results found for: HDL No results found for: LDLCALC No results found for: TRIG No results found for: CHOLHDL No results found for: [id number]    Assessment & Plan:  [name] was seen today for sinus drainge.  Diagnoses and all orders for this visit:  Tobacco use disorder Discussed the increased risk for lung cancer and other respiratory diseases recommend cessation.  This will be reminded at each clinical visit.    Essential hypertension Uncontrolled at 141/90 discussed  blood pressure goal of less than 130/80, low-sodium, DASH diet, medication compliance, 150 minutes of moderate intensity exercise per week.Discussed medication compliance, adverse effects. CURRENTLY ON LISINOPRIL 20MG  DAILY  Allergic rhinitis, unspecified seasonality, unspecified trigger -     Ambulatory referral to ENT  Other orders -     lisinopril (ZESTRIL) 20 MG tablet; Take 1 tablet (20 mg total) by mouth daily.   COVID-19 positive test (U07.1, COVID-19) with Acute Respiratory Distress Syndrome (ARDS) (J80, ARDS) (If respiratory failure or sepsis present, add as separate  assessmen   Meds ordered this encounter  Medications  . lisinopril (ZESTRIL) 20 MG tablet    Sig: Take 1 tablet (20 mg total) by mouth daily.    Dispense:  30 tablet    Refill:  3    Follow-up: Return in about 2 months (around 11/16/2019) for Tele Bp reading (  Patient to purchase Bp monitor).    Kerin Perna, NP

## 2019-10-04 ENCOUNTER — Telehealth (INDEPENDENT_AMBULATORY_CARE_PROVIDER_SITE_OTHER): Payer: Self-pay

## 2019-10-04 NOTE — Telephone Encounter (Signed)
FWD to PCP. Dempsey Knotek S Etienne Mowers, CMA  

## 2019-10-04 NOTE — Telephone Encounter (Signed)
Patient wife called in regards to her husbands runny noise. States her husband spoke to [place] on Sep 16, 2019 for an office visit and she advice patient that she would send in a medication to help with the runny. Patient wife states she only sent Lisinopril.    Patient was referred to ent but Dr.Newman does not take [facility] discount. Sent referral to Dr. Melene Plan. Patient is requesting a call back   Please address [phone]   Thank you Whitney Post

## 2019-10-05 ENCOUNTER — Other Ambulatory Visit (INDEPENDENT_AMBULATORY_CARE_PROVIDER_SITE_OTHER): Payer: Self-pay | Admitting: Primary Care

## 2019-10-05 MED ORDER — LORATADINE 10 MG PO TABS: 10.0000 mg | ORAL_TABLET | Freq: Every day | ORAL | 11 refills | Status: DC

## 2019-11-16 ENCOUNTER — Ambulatory Visit (INDEPENDENT_AMBULATORY_CARE_PROVIDER_SITE_OTHER): Payer: Self-pay | Admitting: Primary Care

## 2019-11-16 ENCOUNTER — Other Ambulatory Visit: Payer: Self-pay

## 2019-11-16 DIAGNOSIS — F17200 Nicotine dependence, unspecified, uncomplicated: Secondary | ICD-10-CM

## 2019-11-16 DIAGNOSIS — I10 Essential (primary) hypertension: Secondary | ICD-10-CM

## 2019-11-16 DIAGNOSIS — R05 Cough: Secondary | ICD-10-CM

## 2019-11-16 DIAGNOSIS — R059 Cough, unspecified: Secondary | ICD-10-CM

## 2019-11-16 MED ORDER — LISINOPRIL 20 MG PO TABS: 20.0000 mg | ORAL_TABLET | Freq: Every day | ORAL | 1 refills | Status: DC

## 2019-11-16 NOTE — Progress Notes (Signed)
Virtual Visit via Telephone Note  I connected with [name] on 11/16/19 at  4:10 PM EST by telephone and verified that I am speaking with the correct person using two identifiers.   I discussed the limitations, risks, security and privacy concerns of performing an evaluation and management service by telephone and the availability of in person appointments. I also discussed with the patient that there may be a patient responsible charge related to this service. The patient expressed understanding and agreed to proceed.   History of Present Illness: [name] is having a tele for elevated Bp-  < 130/90 unable to provide ranges .He  is followed by Dr. Donato Schultz for paroxymal atrial fibrillation advise to continue follow up with him.  He is complaining of cold like symptoms does not think he's been around anyone with COVID says in mostly.   Past Medical History:  Diagnosis Date  . Atrial fibrillation with RVR ([facility]) 06/02/2018  . 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 Multifocal atrial tachycardia ([facility]) 06/02/2018   Current Outpatient Medications on File Prior to Visit  Medication Sig Dispense Refill  . diltiazem (CARDIZEM CD) 120 MG 24 hr capsule TAKE 1 CAPSULE BY MOUTH EVERY DAY 90 capsule 3  . diltiazem (CARDIZEM) 30 MG tablet Take 1 tablet every 4 hours AS NEEDED for AFIB heart rate >100 45 tablet 1  . loratadine (CLARITIN) 10 MG tablet Take 1 tablet (10 mg total) by mouth daily. 30 tablet 11   No current facility-administered medications on file prior to visit.   Observations/Objective: Review of Systems  HENT: Positive for congestion.   Respiratory: Positive for cough.   All other systems reviewed and are negative.  Assessment and Plan: Diagnoses and all orders for this visit:  Tobacco use disorder He is aware of increased risk for lung cancer and other respiratory diseases This may be underlying cause of cough. Cessation will be  reminded at each clinical visit.  Essential hypertension Added lisinopril 20mg  daily and continue to take diltiazem 120mg  24hr daily and blood pressure goal of less than 130/80, low-sodium, DASH diet, medication compliance, 150 minutes of moderate intensity exercise per week.  Cough May use over the counter medication to specifically treat the symptoms  Cough-cough medication Robitussin can be helpful and a anti mucolytic quafenssain can loosen mucus to expel   Other orders -     lisinopril (ZESTRIL) 20 MG tablet; Take 1 tablet (20 mg total) by mouth daily.    Follow Up Instructions:    I discussed the assessment and treatment plan with the patient. The patient was provided an opportunity to ask questions and all were answered. The patient agreed with the plan and demonstrated an understanding of the instructions.   The patient was advised to call back or seek an in-person evaluation if the symptoms worsen or if the condition fails to improve as anticipated.  I provided 10 minutes of non-face-to-face time during this encounter.   , NP

## 2019-11-30 ENCOUNTER — Encounter (INDEPENDENT_AMBULATORY_CARE_PROVIDER_SITE_OTHER): Payer: Self-pay | Admitting: Primary Care

## 2019-11-30 ENCOUNTER — Other Ambulatory Visit: Payer: Self-pay

## 2019-11-30 ENCOUNTER — Ambulatory Visit (INDEPENDENT_AMBULATORY_CARE_PROVIDER_SITE_OTHER): Payer: Self-pay | Admitting: Primary Care

## 2019-11-30 VITALS — BP 141/87 | HR 68 | Temp 97.3°F | Ht 70.0 in | Wt 191.6 lb

## 2019-11-30 DIAGNOSIS — F17210 Nicotine dependence, cigarettes, uncomplicated: Secondary | ICD-10-CM

## 2019-11-30 DIAGNOSIS — I10 Essential (primary) hypertension: Secondary | ICD-10-CM

## 2019-11-30 DIAGNOSIS — Z760 Encounter for issue of repeat prescription: Secondary | ICD-10-CM

## 2019-11-30 MED ORDER — DILTIAZEM HCL 30 MG PO TABS: ORAL_TABLET | ORAL | 1 refills | Status: DC

## 2019-11-30 MED ORDER — LISINOPRIL 20 MG PO TABS: 20.0000 mg | ORAL_TABLET | Freq: Every day | ORAL | 1 refills | Status: DC

## 2019-11-30 MED ORDER — DILTIAZEM HCL ER COATED BEADS 120 MG PO CP24: ORAL_CAPSULE | ORAL | 3 refills | Status: DC

## 2019-11-30 NOTE — Progress Notes (Signed)
Established Patient Office Visit  Subjective:  Patient ID: [name], male    DOB: 12-27-1965  Age: 54 y.o. MRN: [id number]  CC:  Chief Complaint  Patient presents with  . Hernia    HPI [name] presents for a bulge in his groin area . Also concern with colon cancer he has a family history. Blood pressure elevated 141/87 Denies shortness of breath, headaches, chest pain or lower extremity edema. He is followed by cardiology for paroxysmal atrial fibrillation   Past Medical History:  Diagnosis Date  . Atrial fibrillation with RVR ([place]) 06/02/2018  . 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 EYE SURGERY    . INGUINAL HERNIA REPAIR Right 1969  . RETINAL DETACHMENT SURGERY Left ~ 1974   "blind since"    Family History  Problem Relation Age of Onset  . Healthy Father     Social History   Socioeconomic History  . Marital status: Married    Spouse name: Not on file  . Number of children: Not on file  . Years of education: Not on file  . Highest education level: Not on file  Occupational History  . Not on file  Tobacco Use  . Smoking status: Current Every Day Smoker    Packs/day: 1.50    Years: 43.00    Pack years: 64.50    Types: Cigarettes  . Smokeless tobacco: Never Used  Substance and Sexual Activity  . Alcohol use: Not Currently    Comment: 06/03/2018 "nothing in years"  . Drug use: Not Currently    Comment: 06/03/2018 "nothing in years"  . Sexual activity: Yes  Other Topics Concern  . Not on file  Social History Narrative  . Not on file   Social Determinants of Health   Financial Resource Strain:   . Difficulty of Paying Living Expenses: Not on file  Food Insecurity:   . Worried About Charity fundraiser in the Last Year: Not on file  . Ran Out of Food in the Last Year: Not on file  Transportation Needs:   .  Lack of Transportation (Medical): Not on file  . Lack of Transportation (Non-Medical): Not on file  Physical Activity:   . Days of Exercise per Week: Not on file  . Minutes of Exercise per Session: Not on file  Stress:   . Feeling of Stress : Not on file  Social Connections:   . Frequency of Communication with Friends and Family: Not on file  . Frequency of Social Gatherings with Friends and Family: Not on file  . Attends Religious Services: Not on file  . Active Member of Clubs or Organizations: Not on file  . Attends Archivist Meetings: Not on file  . Marital Status: Not on file  Intimate Partner Violence:   . Fear of Current or Ex-Partner: Not on file  . Emotionally Abused: Not on file  . Physically Abused: Not on file  . Sexually Abused: Not on file    Outpatient Medications Prior to Visit  Medication Sig Dispense Refill  . diltiazem (CARDIZEM) 30 MG tablet Take 1 tablet every 4 hours AS NEEDED for AFIB heart rate >100 45 tablet 1  . lisinopril (ZESTRIL) 20 MG tablet Take 1 tablet (20 mg total) by mouth daily. 90 tablet 1  . loratadine (  CLARITIN) 10 MG tablet Take 1 tablet (10 mg total) by mouth daily. (Patient not taking: Reported on 11/30/2019) 30 tablet 11  . diltiazem (CARDIZEM CD) 120 MG 24 hr capsule TAKE 1 CAPSULE BY MOUTH EVERY DAY (Patient not taking: Reported on 11/30/2019) 90 capsule 3   No facility-administered medications prior to visit.    No Known Allergies  ROS Review of Systems  Gastrointestinal: Positive for abdominal distention.  All other systems reviewed and are negative.     Objective:    Physical Exam  Constitutional: He is oriented to person, place, and time. He appears well-developed and well-nourished.  HENT:  Head: Normocephalic and atraumatic.  Eyes: Pupils are equal, round, and reactive to light. EOM are normal.  No reflex /PERRL  Abdominal: Soft. Bowel sounds are normal.  Musculoskeletal:        General: Normal range of  motion.     Cervical back: Normal range of motion and neck supple.  Neurological: He is oriented to person, place, and time. He has normal reflexes.  Skin: Skin is warm and dry.  Psychiatric: He has a normal mood and affect. His behavior is normal. Judgment and thought content normal.    BP (!) 141/87 (BP Location: Right Arm, Patient Position: Sitting, Cuff Size: Normal)   Pulse 68   Temp (!) 97.3 F (36.3 C) (Temporal)   Ht '5\' 10"'  (1.778 m)   Wt 191 lb 9.6 oz (86.9 kg)   SpO2 93%   BMI 27.49 kg/m  Wt Readings from Last 3 Encounters:  11/30/19 191 lb 9.6 oz (86.9 kg)  09/16/19 196 lb (88.9 kg)  05/11/19 189 lb 6.4 oz (85.9 kg)     Health Maintenance Due  Topic Date Due  . Fecal DNA (Cologuard)  08/25/2014    There are no preventive care reminders to display for this patient.  Lab Results  Component Value Date   TSH 1.150 06/03/2018   Lab Results  Component Value Date   WBC 15.3 (H) 06/03/2018   HGB 14.5 06/03/2018   HCT 45.6 06/03/2018   MCV 85.2 06/03/2018   PLT 193 06/03/2018   Lab Results  Component Value Date   NA 136 06/03/2018   K 4.2 06/03/2018   CO2 24 06/03/2018   GLUCOSE 206 (H) 06/03/2018   BUN 12 06/03/2018   CREATININE 1.08 06/03/2018   BILITOT 0.5 06/02/2018   ALKPHOS 63 06/02/2018   AST 54 (H) 06/02/2018   ALT 87 (H) 06/02/2018   PROT 9.0 (H) 06/02/2018   ALBUMIN 4.0 06/02/2018   CALCIUM 9.2 06/03/2018   ANIONGAP 12 06/03/2018   No results found for: CHOL No results found for: HDL No results found for: LDLCALC No results found for: TRIG No results found for: CHOLHDL No results found for: HGBA1C    Assessment & Plan:  Salvadore was seen today for hernia.  Diagnoses and all orders for this visit:  Cigarette smoker He is well aware of increased risk for lung cancer and other respiratory diseases recommend cessation.  This will be reminded at each clinical visit.  Medication refill -     diltiazem (CARDIZEM CD) 120 MG 24 hr capsule;  TAKE 1 CAPSULE BY MOUTH EVERY DAY  Essential hypertension Counseled on blood pressure goal of less than 130/80, low-sodium, DASH diet, medication compliance,  Discussed medication with wife she voices understanding  diltiazem (CARDIZEM) 30 MG tablet; Take 1 tablet every 4 hours AS NEEDED for AFIB heart rate >100 -  CBC with Differential/Platelet; Future -     Lipid panel; Future -     CMP14+EGFR; Future  Other orders -     diltiazem (CARDIZEM) 30 MG tablet; Take 1 tablet every 4 hours AS NEEDED for AFIB heart rate >100 -     lisinopril (ZESTRIL) 20 MG tablet; Take 1 tablet (20 mg total) by mouth daily.    Meds ordered this encounter  Medications  . diltiazem (CARDIZEM CD) 120 MG 24 hr capsule    Sig: TAKE 1 CAPSULE BY MOUTH EVERY DAY    Dispense:  90 capsule    Refill:  3  . diltiazem (CARDIZEM) 30 MG tablet    Sig: Take 1 tablet every 4 hours AS NEEDED for AFIB heart rate >100    Dispense:  45 tablet    Refill:  1  . lisinopril (ZESTRIL) 20 MG tablet    Sig: Take 1 tablet (20 mg total) by mouth daily.    Dispense:  90 tablet    Refill:  1    Follow-up: No follow-ups on file.    Kerin Perna, NP

## 2019-12-03 MED FILL — DILTIAZEM 24HR ER 120 MG CA: 120 | 30 days supply | Qty: 30 | Fill #0

## 2019-12-03 MED FILL — LISINOPRIL 20 MG TABLET: 20 | 30 days supply | Qty: 30 | Fill #0

## 2020-02-09 MED FILL — DILTIAZEM 24HR ER 120 MG CA: 120 | 30 days supply | Qty: 30 | Fill #1

## 2020-02-09 MED FILL — LISINOPRIL 20 MG TABLET: 20 | 30 days supply | Qty: 30 | Fill #1

## 2020-02-28 ENCOUNTER — Ambulatory Visit (INDEPENDENT_AMBULATORY_CARE_PROVIDER_SITE_OTHER): Payer: Self-pay | Admitting: Primary Care

## 2020-02-28 ENCOUNTER — Other Ambulatory Visit: Payer: Self-pay

## 2020-02-28 ENCOUNTER — Encounter (INDEPENDENT_AMBULATORY_CARE_PROVIDER_SITE_OTHER): Payer: Self-pay | Admitting: Primary Care

## 2020-02-28 VITALS — BP 115/77 | HR 70 | Resp 20 | Wt 192.0 lb

## 2020-02-28 DIAGNOSIS — F17200 Nicotine dependence, unspecified, uncomplicated: Secondary | ICD-10-CM

## 2020-02-28 DIAGNOSIS — I4891 Unspecified atrial fibrillation: Secondary | ICD-10-CM

## 2020-02-28 DIAGNOSIS — N529 Male erectile dysfunction, unspecified: Secondary | ICD-10-CM

## 2020-02-28 DIAGNOSIS — I10 Essential (primary) hypertension: Secondary | ICD-10-CM

## 2020-02-28 MED ORDER — SILDENAFIL CITRATE 50 MG PO TABS: 50.0000 mg | ORAL_TABLET | Freq: Every day | ORAL | 1 refills | Status: DC | PRN

## 2020-02-28 NOTE — Progress Notes (Signed)
Presents for  hypertension evaluation, on previous visit medication was adjusted to include refilling Cardizem  120 mg/24 and Cardizem 30 prn (per instructions from cardiologist 1 tablet every 4 hours AS NEEDED for AFIB heart rate >100) and lisinopril 20mg  daily . Patient states had not had to use  Cardizem 30mg . Denies shortness of breath, headaches, chest pain or lower extremity edema, sudden onset, vision changes, unilateral weakness, dizziness, paresthesias. He admits to having a "little male problems" and would something to help.  Patient reports adherence with medications.  Current Medication List Current Outpatient Medications on File Prior to Visit  Medication Sig Dispense Refill  . diltiazem (CARDIZEM CD) 120 MG 24 hr capsule TAKE 1 CAPSULE BY MOUTH EVERY DAY 90 capsule 3  . diltiazem (CARDIZEM) 30 MG tablet Take 1 tablet every 4 hours AS NEEDED for AFIB heart rate >100 45 tablet 1  . lisinopril (ZESTRIL) 20 MG tablet Take 1 tablet (20 mg total) by mouth daily. 90 tablet 1   No current facility-administered medications on file prior to visit.   Past Medical History  Past Medical History:  Diagnosis Date  . Atrial fibrillation with RVR ([facility]) 06/02/2018  . 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 Multifocal atrial tachycardia ([facility]) 06/02/2018   Dietary habits include: healthy as possible on a fixed income Exercise habits include none-everyday activities   Family / Social history:none ASCVD risk factors include- 08/02/2018  O:  Physical Exam Vitals reviewed.  Cardiovascular:     Rate and Rhythm: Normal rate and regular rhythm.  Pulmonary:     Effort: Pulmonary effort is normal.     Breath sounds: Normal breath sounds.  Abdominal:     General: Bowel sounds are normal. There is distension.  Musculoskeletal:        General: Normal range of motion.     Cervical back: Normal range of motion.  Skin:    General: Skin is warm and dry.  Neurological:     Mental Status: He is alert and oriented to person, place, and time.  Psychiatric:        Mood and Affect: Mood normal.        Behavior: Behavior normal.        Thought Content: Thought content normal.        Judgment: Judgment normal.      Review of Systems  Eyes:       Blindness of left eye,   Genitourinary:       Erectile dsyfunction  All other systems reviewed and are negative.   Last 3 Office BP readings: BP Readings from Last 3 Encounters:  02/28/20 115/77  11/30/19 (!) 141/87  09/16/19 (!) 141/90    BMET    Component Value Date/Time   NA 136 06/03/2018 0720   K 4.2 06/03/2018 0720   CL 100 06/03/2018 0720   CO2 24 06/03/2018 0720   GLUCOSE 206 (H) 06/03/2018 0720   BUN 12 06/03/2018 0720   CREATININE 1.08 06/03/2018 0720   CALCIUM 9.2 06/03/2018 0720   GFRNONAA >60 06/03/2018 0720   GFRAA >60 06/03/2018 0720    Renal function: CrCl cannot be calculated (Patient's most recent lab result is older than the maximum 21 days allowed.).  Clinical ASCVD: Yes  The ASCVD Risk score 08/03/2018 DC Jr., et al., 2013) failed to calculate for the following reasons:   Cannot find a previous HDL lab   Cannot find a  previous total cholesterol lab   A/P: Essential hypertension Hypertension longstanding lisinopril 20mg  daily and on current medications. BP Goal = 130/80  mmHg.Atrial fib Cardizem  120 mg/24 and Cardizem 30 prn (per instructions from cardiologist 1 tablet every 4 hours AS NEEDED for AFIB heart rate >100) Patient is adherent with current medications.  -Continued  -F/u labs ordered - CBC, CMP ,Lipids -Counseled on lifestyle modifications for blood pressure control including reduced dietary sodium, increased exercise, adequate sleep Shreyan was seen today for follow-up.  Erectile dysfunction, unspecified erectile dysfunction type Erectile dysfunction (ED) is the inability to get or keep an erection firm enough to have sexual intercourse. Requesting medication with  Bp and Afib controlled will prescribe Viagra.   Tobacco use disorder He is aware of increased risk for lung cancer and other respiratory diseases recommend cessation.  This will be reminded at each clinical visit.  Atrial fibrillation, unspecified type ([facility]) Manage on  Cardizem  120 mg/24 and Cardizem 30 prn (per instructions from cardiologist 1 tablet every 4 hours AS NEEDED for AFIB heart rate >100)  He is aware any complication with heart rhythm to contact cardiologist  Other orders -     sildenafil (VIAGRA) 50 MG tablet; Take 1 tablet (50 mg total) by mouth daily as needed for erectile dysfunction.

## 2020-02-28 NOTE — Patient Instructions (Signed)

## 2020-02-28 NOTE — Progress Notes (Signed)
F/u HTN 

## 2020-02-29 LAB — CMP14+EGFR
ALT: 37 IU/L (ref 0–44)
AST: 27 IU/L (ref 0–40)
Albumin/Globulin Ratio: 1.2 (ref 1.2–2.2)
Albumin: 4.4 g/dL (ref 3.8–4.9)
Alkaline Phosphatase: 60 IU/L (ref 39–117)
BUN/Creatinine Ratio: 18 (ref 9–20)
BUN: 19 mg/dL (ref 6–24)
Bilirubin Total: 0.2 mg/dL (ref 0.0–1.2)
CO2: 23 mmol/L (ref 20–29)
Calcium: 9.4 mg/dL (ref 8.7–10.2)
Chloride: 101 mmol/L (ref 96–106)
Creatinine, Ser: 1.08 mg/dL (ref 0.76–1.27)
GFR calc Af Amer: 89 mL/min/{1.73_m2} (ref >59)
GFR calc non Af Amer: 77 mL/min/{1.73_m2} (ref >59)
Globulin, Total: 3.7 g/dL (ref 1.5–4.5)
Glucose: 102 mg/dL — ABNORMAL HIGH (ref 65–99)
Potassium: 3.8 mmol/L (ref 3.5–5.2)
Sodium: 138 mmol/L (ref 134–144)
Total Protein: 8.1 g/dL (ref 6.0–8.5)

## 2020-02-29 LAB — CBC WITH DIFFERENTIAL/PLATELET
Basophils Absolute: 0.1 10*3/uL (ref 0.0–0.2)
Basos: 1 %
EOS (ABSOLUTE): 0.2 10*3/uL (ref 0.0–0.4)
Eos: 2 %
Hematocrit: 48.7 % (ref 37.5–51.0)
Hemoglobin: 15.4 g/dL (ref 13.0–17.7)
Immature Grans (Abs): 0 10*3/uL (ref 0.0–0.1)
Immature Granulocytes: 0 %
Lymphocytes Absolute: 2.8 10*3/uL (ref 0.7–3.1)
Lymphs: 26 %
MCH: 27.4 pg (ref 26.6–33.0)
MCHC: 31.6 g/dL (ref 31.5–35.7)
MCV: 87 fL (ref 79–97)
Monocytes Absolute: 1 10*3/uL — ABNORMAL HIGH (ref 0.1–0.9)
Monocytes: 9 %
Neutrophils Absolute: 6.7 10*3/uL (ref 1.4–7.0)
Neutrophils: 62 %
Platelets: 209 10*3/uL (ref 150–450)
RBC: 5.62 x10E6/uL (ref 4.14–5.80)
RDW: 13.2 % (ref 11.6–15.4)
WBC: 10.8 10*3/uL (ref 3.4–10.8)

## 2020-02-29 LAB — LIPID PANEL
Chol/HDL Ratio: 4.8 ratio (ref 0.0–5.0)
Cholesterol, Total: 153 mg/dL (ref 100–199)
HDL: 32 mg/dL — ABNORMAL LOW (ref >39)
LDL Chol Calc (NIH): 96 mg/dL (ref 0–99)
Triglycerides: 137 mg/dL (ref 0–149)
VLDL Cholesterol Cal: 25 mg/dL (ref 5–40)

## 2020-02-29 MED FILL — SILDENAFIL CITRATE 50 MG TA: 50 | 30 days supply | Qty: 10 | Fill #0

## 2020-05-16 MED FILL — LISINOPRIL 20 MG TABLET: 20 | 30 days supply | Qty: 30 | Fill #3

## 2020-05-16 MED FILL — DILTIAZEM 24HR ER 120 MG CA: 120 | 30 days supply | Qty: 30 | Fill #3

## 2020-05-30 ENCOUNTER — Encounter (INDEPENDENT_AMBULATORY_CARE_PROVIDER_SITE_OTHER): Payer: Self-pay | Admitting: Primary Care

## 2020-05-30 ENCOUNTER — Ambulatory Visit (INDEPENDENT_AMBULATORY_CARE_PROVIDER_SITE_OTHER): Payer: Self-pay | Admitting: Primary Care

## 2020-05-30 ENCOUNTER — Other Ambulatory Visit: Payer: Self-pay

## 2020-05-30 VITALS — BP 137/85 | HR 68 | Temp 98.2°F | Ht 70.0 in | Wt 193.0 lb

## 2020-05-30 DIAGNOSIS — Z760 Encounter for issue of repeat prescription: Secondary | ICD-10-CM

## 2020-05-30 DIAGNOSIS — E7219 Other disorders of sulfur-bearing amino-acid metabolism: Secondary | ICD-10-CM

## 2020-05-30 DIAGNOSIS — I10 Essential (primary) hypertension: Secondary | ICD-10-CM

## 2020-05-30 DIAGNOSIS — F17200 Nicotine dependence, unspecified, uncomplicated: Secondary | ICD-10-CM

## 2020-05-30 DIAGNOSIS — J449 Chronic obstructive pulmonary disease, unspecified: Secondary | ICD-10-CM

## 2020-05-30 MED ORDER — DILTIAZEM HCL ER COATED BEADS 120 MG PO CP24: ORAL_CAPSULE | ORAL | 3 refills | Status: DC

## 2020-05-30 MED ORDER — SILDENAFIL CITRATE 50 MG PO TABS: 50.0000 mg | ORAL_TABLET | Freq: Every day | ORAL | 1 refills | Status: DC | PRN

## 2020-05-30 MED ORDER — LISINOPRIL 20 MG PO TABS: 20.0000 mg | ORAL_TABLET | Freq: Every day | ORAL | 1 refills | Status: DC

## 2020-05-30 NOTE — Patient Instructions (Signed)

## 2020-05-30 NOTE — Progress Notes (Signed)
Established Patient Office Visit  Subjective:  Patient ID: [name], male    DOB: 29-Aug-1966  Age: 54 y.o. MRN: [id number]  CC:  Chief Complaint  Patient presents with   Blood Pressure Check    HPI [name] presents for blood pressure follow up. He states he is taking his medications as prescribed . 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    Past Surgical History:  Procedure Laterality Date   EYE SURGERY     INGUINAL HERNIA REPAIR Right 1969   RETINAL DETACHMENT SURGERY Left ~ 1974   "blind since"    Family History  Problem Relation Age of Onset   Healthy Father     Social History   Socioeconomic History   Marital status: Married    Spouse name: Not on file   Number of children: Not on file   Years of education: Not on file   Highest education level: Not on file  Occupational History   Not on file  Tobacco Use   Smoking status: Current Every Day Smoker    Packs/day: 1.50    Years: 43.00    Pack years: 64.50    Types: Cigarettes   Smokeless tobacco: Never Used  Vaping Use   Vaping Use: Former  Substance and Sexual Activity   Alcohol use: Not Currently    Comment: 06/03/2018 "nothing in years"   Drug use: Not Currently    Comment: 06/03/2018 "nothing in years"   Sexual activity: Yes  Other Topics Concern   Not on file  Social History Narrative   Not on file   Social Determinants of Health   Financial Resource Strain:    Difficulty of Paying Living Expenses:   Food Insecurity:    Worried About Programme researcher, broadcasting/film/video in the Last Year:    Barista in the Last Year:   Transportation Needs:    Freight forwarder (Medical):    Lack of Transportation (Non-Medical):    Physical Activity:    Days of Exercise per Week:    Minutes of Exercise per Session:   Stress:    Feeling of Stress :   Social Connections:    Frequency of Communication with Friends and Family:    Frequency of Social Gatherings with Friends and Family:    Attends Religious Services:    Active Member of Clubs or Organizations:    Attends Engineer, structural:    Marital Status:   Intimate Partner Violence:    Fear of Current or Ex-Partner:    Emotionally Abused:    Physically Abused:    Sexually Abused:     Outpatient Medications Prior to Visit  Medication Sig Dispense Refill   diltiazem (CARDIZEM) 30 MG tablet Take 1 tablet every 4 hours AS NEEDED for AFIB heart rate >100 45 tablet 1   diltiazem (CARDIZEM CD) 120 MG 24 hr capsule TAKE 1 CAPSULE BY MOUTH EVERY DAY 90 capsule 3   lisinopril (ZESTRIL) 20 MG tablet Take 1 tablet (20 mg total) by mouth daily. 90 tablet 1   sildenafil (VIAGRA) 50 MG tablet Take 1 tablet (50 mg total) by mouth daily as needed for erectile dysfunction. 10 tablet 1  No facility-administered medications prior to visit.    No Known Allergies  ROS Review of Systems  All other systems reviewed and are negative.     Objective:    Physical Exam Vitals reviewed.  HENT:     Right Ear: Tympanic membrane normal.     Left Ear: Tympanic membrane normal.  Eyes:     Extraocular Movements: Extraocular movements intact.     Pupils: Pupils are equal, round, and reactive to light.  Cardiovascular:     Rate and Rhythm: Normal rate and regular rhythm.  Pulmonary:     Effort: Pulmonary effort is normal.     Breath sounds: Normal breath sounds.  Abdominal:     General: Bowel sounds are normal.  Musculoskeletal:     Cervical back: Normal range of motion.  Neurological:     Mental Status: He is alert and oriented to person, place, and time.  Psychiatric:        Mood and Affect: Mood normal.        Behavior: Behavior normal.         Thought Content: Thought content normal.        Judgment: Judgment normal.     BP 137/85 (BP Location: Right Arm, Patient Position: Sitting, Cuff Size: Normal)    Pulse 68    Temp 98.2 F (36.8 C) (Oral)    Ht 5\' 10"  (1.778 m)    Wt 193 lb (87.5 kg)    SpO2 94%    BMI 27.69 kg/m  Wt Readings from Last 3 Encounters:  05/30/20 193 lb (87.5 kg)  02/28/20 192 lb (87.1 kg)  11/30/19 191 lb 9.6 oz (86.9 kg)     Health Maintenance Due  Topic Date Due   COVID-19 Vaccine (1) Never done   Fecal DNA (Cologuard)  Never done   INFLUENZA VACCINE  05/28/2020    There are no preventive care reminders to display for this patient.  Lab Results  Component Value Date   TSH 1.150 06/03/2018   Lab Results  Component Value Date   WBC 10.8 02/28/2020   HGB 15.4 02/28/2020   HCT 48.7 02/28/2020   MCV 87 02/28/2020   PLT 209 02/28/2020   Lab Results  Component Value Date   NA 138 02/28/2020   K 3.8 02/28/2020   CO2 23 02/28/2020   GLUCOSE 102 (H) 02/28/2020   BUN 19 02/28/2020   CREATININE 1.08 02/28/2020   BILITOT 0.2 02/28/2020   ALKPHOS 60 02/28/2020   AST 27 02/28/2020   ALT 37 02/28/2020   PROT 8.1 02/28/2020   ALBUMIN 4.4 02/28/2020   CALCIUM 9.4 02/28/2020   ANIONGAP 12 06/03/2018   Lab Results  Component Value Date   CHOL 153 02/28/2020   Lab Results  Component Value Date   HDL 32 (L) 02/28/2020   Lab Results  Component Value Date   LDLCALC 96 02/28/2020   Lab Results  Component Value Date   TRIG 137 02/28/2020   Lab Results  Component Value Date   CHOLHDL 4.8 02/28/2020   No results found for: HGBA1C    Assessment & Plan:  [name] was seen today for blood pressure check.  Diagnoses and all orders for this visit:  Essential hypertension Counseled on blood pressure goal of less than 130/80, low-sodium, DASH diet, medication compliance, 150 minutes of moderate intensity exercise per week.  Tobacco use disorder He is aware of increased risk for  lung cancer and other respiratory diseases recommend cessation.  This  will be reminded at each clinical visit. 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 Cigarette smoking does not help   Other orders/Medication refil -     lisinopril (ZESTRIL) 20 MG tablet; Take 1 tablet (20 mg total) by mouth daily. -     sildenafil (VIAGRA) 50 MG tablet; Take 1 tablet (50 mg total) by mouth daily as needed for erectile dysfunction. -     diltiazem (CARDIZEM CD) 120 MG 24 hr capsule; TAKE 1 CAPSULE BY MOUTH EVERY DAY   Meds ordered this encounter  Medications   diltiazem (CARDIZEM CD) 120 MG 24 hr capsule    Sig: TAKE 1 CAPSULE BY MOUTH EVERY DAY    Dispense:  90 capsule    Refill:  3   lisinopril (ZESTRIL) 20 MG tablet    Sig: Take 1 tablet (20 mg total) by mouth daily.    Dispense:  90 tablet    Refill:  1   sildenafil (VIAGRA) 50 MG tablet    Sig: Take 1 tablet (50 mg total) by mouth daily as needed for erectile dysfunction.    Dispense:  10 tablet    Refill:  1    Follow-up: Return in about 6 months (around 11/30/2020) for routine check up Bp .    Grayce Sessions, NP

## 2020-07-17 ENCOUNTER — Other Ambulatory Visit (INDEPENDENT_AMBULATORY_CARE_PROVIDER_SITE_OTHER): Payer: Self-pay | Admitting: Primary Care

## 2020-07-17 MED FILL — LISINOPRIL 20 MG TABLET: 20 | 30 days supply | Qty: 30 | Fill #4

## 2020-07-17 MED FILL — SILDENAFIL CITRATE 50 MG TA: 50 | 30 days supply | Qty: 10 | Fill #0

## 2020-07-17 MED FILL — DILTIAZEM 24HR ER 120 MG CA: 120 | 30 days supply | Qty: 30 | Fill #4

## 2020-08-26 ENCOUNTER — Observation Stay (HOSPITAL_COMMUNITY)
Admission: EM | Admit: 2020-08-26 | Discharge: 2020-08-27 | Disposition: A | Discharge status: Home / Self Care | Payer: Self-pay | Attending: Family Medicine | Admitting: Family Medicine

## 2020-08-26 ENCOUNTER — Other Ambulatory Visit: Payer: Self-pay

## 2020-08-26 ENCOUNTER — Emergency Department (HOSPITAL_COMMUNITY): Payer: Self-pay

## 2020-08-26 DIAGNOSIS — Z20822 Contact with and (suspected) exposure to covid-19: Secondary | ICD-10-CM | POA: Insufficient documentation

## 2020-08-26 DIAGNOSIS — I4891 Unspecified atrial fibrillation: Secondary | ICD-10-CM

## 2020-08-26 DIAGNOSIS — F1910 Other psychoactive substance abuse, uncomplicated: Secondary | ICD-10-CM | POA: Insufficient documentation

## 2020-08-26 DIAGNOSIS — R0902 Hypoxemia: Secondary | ICD-10-CM

## 2020-08-26 DIAGNOSIS — R4182 Altered mental status, unspecified: Secondary | ICD-10-CM | POA: Insufficient documentation

## 2020-08-26 DIAGNOSIS — F10929 Alcohol use, unspecified with intoxication, unspecified: Secondary | ICD-10-CM

## 2020-08-26 DIAGNOSIS — Z79899 Other long term (current) drug therapy: Secondary | ICD-10-CM | POA: Insufficient documentation

## 2020-08-26 DIAGNOSIS — I10 Essential (primary) hypertension: Secondary | ICD-10-CM | POA: Insufficient documentation

## 2020-08-26 DIAGNOSIS — F10129 Alcohol abuse with intoxication, unspecified: Secondary | ICD-10-CM | POA: Insufficient documentation

## 2020-08-26 DIAGNOSIS — F17210 Nicotine dependence, cigarettes, uncomplicated: Secondary | ICD-10-CM | POA: Insufficient documentation

## 2020-08-26 DIAGNOSIS — R569 Unspecified convulsions: Principal | ICD-10-CM | POA: Insufficient documentation

## 2020-08-26 DIAGNOSIS — J449 Chronic obstructive pulmonary disease, unspecified: Secondary | ICD-10-CM | POA: Insufficient documentation

## 2020-08-26 LAB — COMPREHENSIVE METABOLIC PANEL
ALT: 29 U/L (ref 0–44)
AST: 28 U/L (ref 15–41)
Albumin: 4.1 g/dL (ref 3.5–5.0)
Alkaline Phosphatase: 47 U/L (ref 38–126)
Anion gap: 11 (ref 5–15)
BUN: 11 mg/dL (ref 6–20)
CO2: 23 mmol/L (ref 22–32)
Calcium: 8.6 mg/dL — ABNORMAL LOW (ref 8.9–10.3)
Chloride: 101 mmol/L (ref 98–111)
Creatinine, Ser: 0.89 mg/dL (ref 0.61–1.24)
GFR, Estimated: >60 mL/min (ref >60)
Glucose, Bld: 138 mg/dL — ABNORMAL HIGH (ref 70–99)
Potassium: 3.5 mmol/L (ref 3.5–5.1)
Sodium: 135 mmol/L (ref 135–145)
Total Bilirubin: 0.6 mg/dL (ref 0.3–1.2)
Total Protein: 7.9 g/dL (ref 6.5–8.1)

## 2020-08-26 LAB — CBC WITH DIFFERENTIAL/PLATELET
Abs Immature Granulocytes: 0.06 10*3/uL (ref 0.00–0.07)
Basophils Absolute: 0.1 10*3/uL (ref 0.0–0.1)
Basophils Relative: 1 %
Eosinophils Absolute: 0.3 10*3/uL (ref 0.0–0.5)
Eosinophils Relative: 3 %
HCT: 50.4 % (ref 39.0–52.0)
Hemoglobin: 15.7 g/dL (ref 13.0–17.0)
Immature Granulocytes: 1 %
Lymphocytes Relative: 36 %
Lymphs Abs: 3.8 10*3/uL (ref 0.7–4.0)
MCH: 27.2 pg (ref 26.0–34.0)
MCHC: 31.2 g/dL (ref 30.0–36.0)
MCV: 87.2 fL (ref 80.0–100.0)
Monocytes Absolute: 1 10*3/uL (ref 0.1–1.0)
Monocytes Relative: 10 %
Neutro Abs: 5.3 10*3/uL (ref 1.7–7.7)
Neutrophils Relative %: 49 %
Platelets: 194 10*3/uL (ref 150–400)
RBC: 5.78 MIL/uL (ref 4.22–5.81)
RDW: 13.9 % (ref 11.5–15.5)
WBC: 10.6 10*3/uL — ABNORMAL HIGH (ref 4.0–10.5)
nRBC: 0 % (ref 0.0–0.2)

## 2020-08-26 LAB — ETHANOL: Alcohol, Ethyl (B): 190 mg/dL — ABNORMAL HIGH (ref <10)

## 2020-08-26 LAB — CBG MONITORING, ED: Glucose-Capillary: 130 mg/dL — ABNORMAL HIGH (ref 70–99)

## 2020-08-26 LAB — MAGNESIUM: Magnesium: 2 mg/dL (ref 1.7–2.4)

## 2020-08-26 MED ORDER — LACTATED RINGERS IV BOLUS
1000.0000 mL | Freq: Once | INTRAVENOUS | Status: AC
  Administered 2020-08-26: 1000 mL via INTRAVENOUS

## 2020-08-26 MED ORDER — ONDANSETRON HCL 4 MG/2ML IJ SOLN
4.0000 mg | Freq: Once | INTRAMUSCULAR | Status: AC
  Administered 2020-08-26: 4 mg via INTRAVENOUS
  Filled 2020-08-26: qty 2

## 2020-08-26 NOTE — ED Triage Notes (Signed)
Pt from home by EMS; say family told them pt was drinking heavily tonight to celebrate his birthday and began having seizures while sitting in a chair.  Say pt with remote hx of one seizure many years ago.  Deny medical hx, home meds, and allergies.  Pt periodically alert, then becomes lethargic again.  Pt placed on monitor with 15L NRB.

## 2020-08-26 NOTE — ED Provider Notes (Signed)
[facility] EMERGENCY DEPARTMENT Provider Note   CSN: [id number] Arrival date & time: 08/26/20  2135     History Chief Complaint  Patient presents with  . Seizures    [name] is a 54 y.o. male.  Per wife at bedside, patient has a history of seizure several years ago, not on AEDs. Tonight, had a lot of alcohol with friends for a celebration, does not usually drink alcohol. He then began to have a few episodes of unresponsiveness and slack limbs similar to his seizure several years ago. She denies any known trauma or other symptoms. She reports he seems confused but does not know if it's because he is drunk or may have had other substances.   The history is provided by the patient and the spouse. The history is limited by the condition of the patient.  Seizures Seizure activity on arrival: no   Initial focality:  None Episode characteristics: limpness and unresponsiveness   Return to baseline: no   Severity:  Moderate Timing:  Clustered Progression:  Unchanged Recent head injury:  No recent head injuries PTA treatment:  None History of seizures: yes   Similar to previous episodes: yes        Past Medical History:  Diagnosis Date  . Atrial fibrillation with RVR (HCC) 06/02/2018  . 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 AMS (altered mental status) 08/27/2020  . Polysubstance abuse (HCC) 03/29/2019  . Essential hypertension 10/31/2018  . Abnormal chest CT 08/28/2018  . COPD GOLD 0 still smoking  08/27/2018  . Paroxysmal atrial fibrillation (HCC) 08/27/2018  . 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 Acute bronchitis 06/03/2018  . 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 COCAINE DEPENDENCY NOS 12/25/2006  . ALCOHOL ABUSE, UNSPECIFIED 12/25/2006  . Cigarette smoker  12/25/2006  . DEPRESSIVE DISORDER, NOS 12/25/2006    Past Surgical History:  Procedure Laterality Date  . EYE SURGERY    . INGUINAL HERNIA REPAIR Right 1969  . RETINAL DETACHMENT SURGERY Left ~ 1974   "blind since"       Family History  Problem Relation Age of Onset  . Healthy Father     Social History   Tobacco Use  . Smoking status: Current Every Day Smoker    Packs/day: 1.50    Years: 43.00    Pack years: 64.50    Types: Cigarettes  . Smokeless tobacco: Never Used  Vaping Use  . Vaping Use: Former  Substance Use Topics  . Alcohol use: Not Currently    Comment: 06/03/2018 "nothing in years"  . Drug use: Not Currently    Comment: 06/03/2018 "nothing in years"    Home Medications Prior to Admission medications   Medication Sig Start Date End Date Taking? Authorizing Provider  diltiazem (CARDIZEM CD) 120 MG 24 hr capsule TAKE 1 CAPSULE BY MOUTH EVERY DAY 05/30/20   Grayce Sessions, NP  diltiazem (CARDIZEM) 30 MG tablet Take 1 tablet every 4 hours AS NEEDED for AFIB heart rate >100 Patient not taking: Reported on 08/27/2020 11/30/19   Grayce Sessions, NP  lisinopril (ZESTRIL) 20 MG tablet Take 1 tablet (20 mg total) by mouth daily. 05/30/20   Grayce Sessions, NP  sildenafil (VIAGRA) 50 MG tablet TAKE 1 TABLET (50 MG TOTAL) BY MOUTH DAILY AS  NEEDED FOR ERECTILE DYSFUNCTION. 07/17/20   Grayce Sessions, NP    Allergies    Patient has no known allergies.  Review of Systems   Review of Systems  Unable to perform ROS: Mental status change  Neurological: Positive for seizures.    Physical Exam Updated Vital Signs BP (!) 164/102   Pulse 94   Temp 97.6 F (36.4 C)   Resp (!) 22   Ht 5\' 10"  (1.778 m)   Wt 87.5 kg   SpO2 98%   BMI 27.68 kg/m   Physical Exam Vitals and nursing note reviewed.  Constitutional:      Appearance: He is well-developed. He is not ill-appearing, toxic-appearing or diaphoretic.  HENT:     Head: Normocephalic and atraumatic.      Mouth/Throat:     Mouth: Mucous membranes are dry.     Pharynx: Oropharynx is clear.  Eyes:     Conjunctiva/sclera:     Right eye: Right conjunctiva is injected.     Left eye: Left conjunctiva is injected.     Pupils:     Right eye: Pupil is reactive.     Left eye: Pupil is not reactive.  Cardiovascular:     Rate and Rhythm: Normal rate and regular rhythm.     Heart sounds: No murmur heard.   Pulmonary:     Effort: Pulmonary effort is normal. No respiratory distress.     Breath sounds: Normal breath sounds.  Abdominal:     Palpations: Abdomen is soft.     Tenderness: There is no abdominal tenderness.  Musculoskeletal:     Cervical back: Neck supple.  Skin:    General: Skin is warm and dry.  Neurological:     Mental Status: He is alert. He is disoriented.     Comments: Mental status: alert and oriented to person, place, but not time or situation. Speech: Speech is mildly slurred and language is not aphasic Fund of knowledge: Intact  Cranial Nerves:  II: Intact to confrontation bilaterally III, IV, VI: EOMI, no nystagmus V: face sensation intact, good masseter strength VII: no facial droop or weakness VIII: gross hearing intact bilaterally IX/XI: palate elevates symmetrically XII: tongue protrudes symmetrically, no deviation  Strength: 5/5 and symmetric in BUE and BLE. No pronation or drift. Tone: normal tone, no tremors Coordination: mildly ataxic with finger to nose and heel to shin. Sensation: intact to light touch in all extremities.  Romberg not assessed due to fall risk.  Gait: not assessed due to fall risk      ED Results / Procedures / Treatments   Labs (all labs ordered are listed, but only abnormal results are displayed) Labs Reviewed  CBC WITH DIFFERENTIAL/PLATELET - Abnormal; Notable for the following components:      Result Value   WBC 10.6 (*)    All other components within normal limits  COMPREHENSIVE METABOLIC PANEL - Abnormal; Notable for the  following components:   Glucose, Bld 138 (*)    Calcium 8.6 (*)    All other components within normal limits  URINALYSIS, ROUTINE W REFLEX MICROSCOPIC - Abnormal; Notable for the following components:   Hgb urine dipstick SMALL (*)    Bacteria, UA RARE (*)    All other components within normal limits  ETHANOL - Abnormal; Notable for the following components:   Alcohol, Ethyl (B) 190 (*)    All other components within normal limits  CBG MONITORING, ED - Abnormal; Notable for the following components:  Glucose-Capillary 130 (*)    All other components within normal limits  RAPID URINE DRUG SCREEN, HOSP PERFORMED  MAGNESIUM    EKG EKG Interpretation  Date/Time:  Saturday August 26 2020 21:36:46 EDT Ventricular Rate:  59 PR Interval:    QRS Duration: 104 QT Interval:  435 QTC Calculation: 431 R Axis:   -15 Text Interpretation: Sinus rhythm Borderline left axis deviation Low voltage, precordial leads Minimal ST elevation, inferior leads Confirmed by Tilden Fossa [phone]) on 08/26/2020 10:25:57 PM   Radiology CT Head Wo Contrast  Result Date: 08/26/2020 CLINICAL DATA:  Seizure. EXAM: CT HEAD WITHOUT CONTRAST TECHNIQUE: Contiguous axial images were obtained from the base of the skull through the vertex without intravenous contrast. COMPARISON:  August 12, 2017 FINDINGS: Brain: No evidence of acute infarction, hemorrhage, hydrocephalus, extra-axial collection or mass lesion/mass effect. Vascular: No hyperdense vessel or unexpected calcification. Skull: Normal. Negative for fracture or focal lesion. Sinuses/Orbits: No acute finding. Prior scleral banding is seen on the left. Other: None. IMPRESSION: No acute intracranial abnormality. Electronically Signed   By: Aram Candela M.D.   On: 08/26/2020 22:18    Procedures Procedures (including critical care time)  Medications Ordered in ED Medications  lactated ringers bolus 1,000 mL (0 mLs Intravenous Stopped 08/26/20 2336)    ondansetron (ZOFRAN) injection 4 mg (4 mg Intravenous Given 08/26/20 2240)    ED Course  I have reviewed the triage vital signs and the nursing notes.  Pertinent labs & imaging results that were available during my care of the patient were reviewed by me and considered in my medical decision making (see chart for details).    MDM Rules/Calculators/A&P                          The patient is a 54yo male, PMH paroxysmal afib not on AC due to low CHADSVASC, HTN, prior seizure not on AEDs, who presents to the ED via EMS for seizure.  On my initial evaluation, the patient is hemodynamically stable, afebrile, nontoxic-appearing. Physical exam remarkable for disorientation and some ataxia. EKG with normal sinus rhythm.  Differentials considered include seizure, syncope, ICH, EtOH intoxication, other substance use, infection. I am most concerned for seizure, possibly due to alcohol use. Repeat EKG with afib, but patient has history of paroxysmal afib.   CT head obtained and unremarkable. Patient provided IV fluid bolus for dry mucous membranes and alcohol use. Called to bedside for reevaluation, patient vomiting. Provided IV zofran.  Labs remarkable for normal glucose. Called back to bedside for possible seizure. On my entering room, patient is awake and alert, oriented to person and place. He continues to appear to be intoxicated as we were conversing. He then went unresponsive, limp, had snoring respirations, and developed sinus tachycardia with rise in HR from 80s to 120s. After 20 seconds, patient abruptly became responsive, tachycardia resolved, and there was no postictal period. Patient did not seem to realize that there had been a break in conversation, almost similar to an absence seizure. Considering patient became unresponsive prior to HR change, do not think this was a cardiogenic syncope, but rather I am quite concerned that I witnessed a seizure. Patient's wife reports that this is how he  acted with the episode at their home.  Patient's episode here at the ED resolved prior to medication administration, and since patient did not seem to have a postictal period, suspect his disorientation is from EtOH use and not status epilepticus.  CT head unremarkable. Consulted neurology, who reported significant concern for focal frontal lobe seizures and reported they would evaluate. Consulted internal medicine for admission for AMS in setting of alcohol use, concern for multiple seizures that may or may not be related to alcohol use. No further acute events while under my care.   The care of this patient was overseen by Dr. Madilyn Hookees, who agreed with evaluation and plan of care.   Final Clinical Impression(s) / ED Diagnoses Final diagnoses:  Seizure (HCC)  Alcoholic intoxication with complication [facility](HCC)    Rx / DC Orders ED Discharge Orders    None       Loletha CarrowEaston, Reagan Klemz, MD 08/27/20 [id number]    Tilden Fossaees, Elizabeth, MD 08/27/20 1758

## 2020-08-27 ENCOUNTER — Encounter (HOSPITAL_COMMUNITY): Payer: Self-pay | Admitting: Internal Medicine

## 2020-08-27 ENCOUNTER — Observation Stay (HOSPITAL_COMMUNITY): Payer: Self-pay

## 2020-08-27 DIAGNOSIS — G9341 Metabolic encephalopathy: Secondary | ICD-10-CM

## 2020-08-27 DIAGNOSIS — I4891 Unspecified atrial fibrillation: Secondary | ICD-10-CM

## 2020-08-27 DIAGNOSIS — R569 Unspecified convulsions: Secondary | ICD-10-CM

## 2020-08-27 DIAGNOSIS — R4182 Altered mental status, unspecified: Secondary | ICD-10-CM | POA: Diagnosis present

## 2020-08-27 DIAGNOSIS — I10 Essential (primary) hypertension: Secondary | ICD-10-CM

## 2020-08-27 LAB — URINALYSIS, ROUTINE W REFLEX MICROSCOPIC
Bilirubin Urine: NEGATIVE
Glucose, UA: NEGATIVE mg/dL
Ketones, ur: NEGATIVE mg/dL
Leukocytes,Ua: NEGATIVE
Nitrite: NEGATIVE
Protein, ur: NEGATIVE mg/dL
Specific Gravity, Urine: 1.01 (ref 1.005–1.030)
pH: 5 (ref 5.0–8.0)

## 2020-08-27 LAB — RESPIRATORY PANEL BY RT PCR (FLU A&B, COVID)
Influenza A by PCR: NEGATIVE
Influenza B by PCR: NEGATIVE
SARS Coronavirus 2 by RT PCR: NEGATIVE

## 2020-08-27 LAB — RAPID URINE DRUG SCREEN, HOSP PERFORMED
Amphetamines: NOT DETECTED
Barbiturates: NOT DETECTED
Benzodiazepines: NOT DETECTED
Cocaine: NOT DETECTED
Opiates: NOT DETECTED
Tetrahydrocannabinol: NOT DETECTED

## 2020-08-27 LAB — D-DIMER, QUANTITATIVE: D-Dimer, Quant: 0.52 ug{FEU}/mL — ABNORMAL HIGH (ref 0.00–0.50)

## 2020-08-27 LAB — HIV ANTIBODY (ROUTINE TESTING W REFLEX): HIV Screen 4th Generation wRfx: NONREACTIVE

## 2020-08-27 LAB — TSH: TSH: 0.597 u[IU]/mL (ref 0.350–4.500)

## 2020-08-27 MED ORDER — ENOXAPARIN SODIUM 40 MG/0.4ML ~~LOC~~ SOLN: 40.0000 mg | SUBCUTANEOUS | Status: DC

## 2020-08-27 MED ORDER — NICOTINE 21 MG/24HR TD PT24
21.0000 mg | MEDICATED_PATCH | Freq: Every day | TRANSDERMAL | Status: DC
  Filled 2020-08-27: qty 1

## 2020-08-27 MED ORDER — DILTIAZEM HCL 30 MG PO TABS
30.0000 mg | ORAL_TABLET | ORAL | Status: DC | PRN
  Filled 2020-08-27: qty 1

## 2020-08-27 MED ORDER — DILTIAZEM HCL ER COATED BEADS 120 MG PO CP24
120.0000 mg | ORAL_CAPSULE | Freq: Every day | ORAL | Status: DC
  Administered 2020-08-27: 120 mg via ORAL
  Filled 2020-08-27: qty 1

## 2020-08-27 MED ORDER — DIVALPROEX SODIUM 500 MG PO DR TAB: 500.0000 mg | DELAYED_RELEASE_TABLET | Freq: Two times a day (BID) | ORAL | 0 refills | Status: AC

## 2020-08-27 MED ORDER — DIVALPROEX SODIUM 250 MG PO DR TAB
500.0000 mg | DELAYED_RELEASE_TABLET | Freq: Two times a day (BID) | ORAL | Status: DC
  Administered 2020-08-27: 500 mg via ORAL
  Filled 2020-08-27: qty 2

## 2020-08-27 MED ORDER — IPRATROPIUM-ALBUTEROL 0.5-2.5 (3) MG/3ML IN SOLN: 3.0000 mL | Freq: Four times a day (QID) | RESPIRATORY_TRACT | Status: DC | PRN

## 2020-08-27 NOTE — Progress Notes (Signed)
[name] has been on Keppra in the past as well as Dilantin and did not tolerate them.  I discussed Depakote and he has agreed to try it.  I would start 500 mg twice daily and have him follow-up for outpatient EEG.  I discussed that he is not allowed to drive by law.  I also discussed the recommendation to not work at [employer].  Ritta Slot, MD [facility] [phone]  If 7pm- 7am, please page neurology on call as listed in AMION.

## 2020-08-27 NOTE — ED Notes (Signed)
Pt to MRI

## 2020-08-27 NOTE — Consult Note (Signed)
Neurology consult H&P  CC: seizure  History is obtained from: wife  HPI: [name] is a 54 y.o. male PMHx DM, PAF, left eye trauma with loss of vision, renal insufficiency, COPD, tobacco use, HTN  and epilepsy not on medication presents with seizure. The patient and his wife had guests visit some of whom he has known for >35 years. His wife mentioned that they don't usually drink a lot but his friend brought small bottles of hard alcohol with them. They started with Bahama-Mamas progressed to bourbon then schnapps. His wife did not witness his consumption but he was found sitting unresponsive. She knew he was having a seizure because these were his typical events and called EMS.  He does not recall what happened.   Previous cocaine abuse - Abstinent for 15 years which his wife says seizure frequency decreased significantly afterwards.  No tongue biting or incontinence noted.   Seizure frequency is not clear however his wife states that after stopping cocaine use, he has had only a few seizures.  Patient works Tree [employer] and does not wear a mask and has chronic cough otherwise no recent illness, hearing loss, fever, chills, myalgias.     ROS: A complete ROS was performed and is negative except as noted in the HPI. Unable to assess due to altered mental status.   Past Medical History:  Diagnosis Date  . Atrial fibrillation with RVR (HCC) 06/02/2018  . 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 Healthy Father    Social History:  reports that he has been smoking cigarettes. He has a 64.50 pack-year smoking history. He has never used smokeless tobacco. He reports previous alcohol use. He reports previous drug use.   Prior to Admission medications   Medication Sig Start Date End Date Taking? Authorizing Provider  diltiazem (CARDIZEM CD) 120 MG  24 hr capsule TAKE 1 CAPSULE BY MOUTH EVERY DAY 05/30/20   Grayce Sessions, NP  diltiazem (CARDIZEM) 30 MG tablet Take 1 tablet every 4 hours AS NEEDED for AFIB heart rate >100 Patient not taking: Reported on 08/27/2020 11/30/19   Grayce Sessions, NP  lisinopril (ZESTRIL) 20 MG tablet Take 1 tablet (20 mg total) by mouth daily. 05/30/20   Grayce Sessions, NP  sildenafil (VIAGRA) 50 MG tablet TAKE 1 TABLET (50 MG TOTAL) BY MOUTH DAILY AS NEEDED FOR ERECTILE DYSFUNCTION. 07/17/20   Grayce Sessions, NP    Exam: Current vital signs: BP 109/79   Pulse 84   Temp 97.6 F (36.4 C)   Resp (!) 25   Ht 5\' 10"  (1.778 m)   Wt 87.5 kg   SpO2 91%   BMI 27.68 kg/m   Physical Exam  Constitutional: Appears well-developed and well-nourished.  Psych: Affect appropriate to situation Eyes: No scleral injection HENT: No OP obstrucion Head: Normocephalic.  Cardiovascular: Normal rate and regular rhythm.  Respiratory: Effort normal and breath sounds normal to anterior ascultation GI: Soft.  No distension. There is no tenderness.  Skin: WDI  Neuro: Mental Status: Somnolent but does wake with stimulation and answers questions  Patient is awake, alert, oriented to person only Patient is not able to give a clear and coherent history. No signs of aphasia or neglect Cranial Nerves: II: Visual Fields are full. Pupils are equal, round, and reactive  to light.  III,IV, VI: EOMI without ptosis or diploplia.  V: Facial sensation is symmetric to temperature VII: Facial movement is symmetric.  VIII: hearing is intact to voice X: Uvula elevates symmetrically XI: Shoulder shrug is symmetric. XII: tongue is midline without atrophy or fasciculations.  Motor: Tone is normal. Bulk is normal. 5/5 strength was present in all four extremities.  Sensory: Sensation is symmetric to light touch and temperature in the arms and legs.  Deep Tendon Reflexes: 2+ and symmetric in the biceps and patellae.   Plantars: Toes are downgoing bilaterally.  Cerebellar: FNF and HKS are intact bilaterally.    I have reviewed labs in epic and the pertinent results are: EtOH: 190 Ca 8.6 Glu :130  I have reviewed the images obtained: NCT head showed did not show acute ischemic changes.   Assessment: [name] is a 54 y.o. male PMHx DM, PAF, left eye trauma with loss of vision, renal insufficiency, COPD, tobacco use, HTN with breakthrough seizure and EtOH 190.   ED provider witnessed seizure and described as (taken from note): "awake and alert, oriented to person and place. He continues to appear to be intoxicated as we were conversing. He then went unresponsive, limp, had snoring respirations, and developed sinus tachycardia with rise in HR from 80s to 120s. After 20 seconds, patient abruptly became responsive, tachycardia resolved, and there was no postictal period. Patient did not seem to realize that there had been a break in conversation, almost similar to an absence seizure."   The patient has been offered antiseizure medication but refuses and per his wife, he says they make him violent. He says they are trying to adopt grandchild and do not want any potential issues.  Impression:  Semiology suggestive of frontal lobe seizure which may be explained by remote trauma which also resulted in retinal tear and loss of vision in left eye. Previous cocaine abuse - 15 years abstinence.  Plan: - MRI brain without contrast.  - rEEG to eval for any epileptogenic discharges. - Recommend Mg, phos, and ionized calcium, CXR, CK. - Neurology will re-evaluate and discuss antiseizure medication.  Electronically signed by: Dr. Marisue Humble Pager: [phone] 08/27/2020, 3:49 AM

## 2020-08-27 NOTE — H&P (Signed)
History and Physical    [name] BPZ:[id number] DOB: 04-Sep-1966 DOA: 08/26/2020  PCP: Grayce Sessions, NP Patient coming from: Home  Chief Complaint: Seizures  HPI: [name] is a 54 y.o. male with medical history significant of paroxysmal A. 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  Patient is currently somnolent and postictal.  He does not recall what happened.  He has no complaints other than a cough.  Denies shortness of breath or chest pain.  History provided mostly by wife at bedside who states that this evening they had a party to celebrate the patient's birthday.  He was drinking very heavily and family then noticed that he had become unresponsive.  When his wife went to check on him he was in the chair with his eyes closed and was unresponsive.  No tongue biting or incontinence noted.  Wife states that the patient does not drink alcohol on a regular basis and consumes it only on occasion.  She is concerned that there were 2 people at the party who might have mixed something in his drink.  She does recall patient having a seizure about 5 years ago while at a skating ring and was evaluated in the ED at that time.  States they had recommended starting him on seizure medications but patient had refused.  ED Course: Hemodynamically stable.  Initially placed on nonrebreather at triage but subsequently noted to be satting well on room air.  WBC 10.6, hemoglobin 15.7, hematocrit 50.4, platelet 194k.  Sodium 135, potassium 3.5, chloride 101, bicarb 23, BUN 11, creatinine 0.8, glucose 138.  LFTs normal.  Blood ethanol level 190.  Magnesium level normal.  UA not suggestive of infection.  UDS negative.  Head CT negative for acute intracranial abnormality. Patient was given Zofran and 1 L LR bolus.  He had additional episodes of seizure-like activity in the ED  witnessed by ED provider where he would become unresponsive, tachycardic, and start having snoring respirations.  Was initially in sinus rhythm but later went into A. fib.    ED provider discussed the case with on-call neurologist who felt that this could be consistent with seizures.  Neurology will consult.  Review of Systems:  All systems reviewed and apart from history of presenting illness, are negative.  Past Medical History:  Diagnosis Date  . Atrial fibrillation with RVR (HCC) 06/02/2018  . 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 EYE SURGERY    . INGUINAL HERNIA REPAIR Right 1969  . RETINAL DETACHMENT SURGERY Left ~ 1974   "blind since"     reports that he has been smoking cigarettes. He has a 64.50 pack-year smoking history. He has never used smokeless tobacco. He reports previous alcohol use. He reports previous drug use.  No Known Allergies  Family History  Problem Relation Age of Onset  . Healthy Father     Prior to Admission medications   Medication Sig Start Date End Date Taking? Authorizing Provider  diltiazem (CARDIZEM CD) 120 MG 24 hr capsule TAKE 1 CAPSULE BY MOUTH EVERY DAY 05/30/20   Grayce Sessions, NP  diltiazem (CARDIZEM) 30 MG tablet Take 1 tablet every 4 hours AS NEEDED for AFIB heart rate >100 Patient not  taking: Reported on 08/27/2020 11/30/19   Grayce SessionsEdwards, Michelle P, NP  lisinopril (ZESTRIL) 20 MG tablet Take 1 tablet (20 mg total) by mouth daily. 05/30/20   Grayce SessionsEdwards, Michelle P, NP  sildenafil (VIAGRA) 50 MG tablet TAKE 1 TABLET (50 MG TOTAL) BY MOUTH DAILY AS NEEDED FOR ERECTILE DYSFUNCTION. 07/17/20   Grayce SessionsEdwards, Michelle P, NP    Physical Exam: Vitals:   08/27/20 0000 08/27/20 0037 08/27/20 0100 08/27/20 0200  BP: 103/72 (!) 164/102 106/76 109/74  Pulse: 82 94 78 84  Resp: 19 (!) 22 13 (!) 27  Temp:  97.6 F (36.4 C)     TempSrc:      SpO2: 93% 98% 93% 91%  Weight:      Height:        Physical Exam Constitutional:      General: He is not in acute distress.    Appearance: He is not diaphoretic.  HENT:     Head: Normocephalic and atraumatic.  Eyes:     Extraocular Movements: Extraocular movements intact.     Conjunctiva/sclera: Conjunctivae normal.  Cardiovascular:     Rate and Rhythm: Normal rate and regular rhythm.     Pulses: Normal pulses.  Pulmonary:     Effort: Pulmonary effort is normal. No respiratory distress.     Breath sounds: No wheezing or rales.  Abdominal:     General: Bowel sounds are normal. There is no distension.     Palpations: Abdomen is soft.     Tenderness: There is no abdominal tenderness.  Musculoskeletal:        General: No swelling or tenderness.     Cervical back: Normal range of motion and neck supple.  Skin:    General: Skin is warm and dry.  Neurological:     General: No focal deficit present.     Mental Status: He is oriented to person, place, and time.     Comments: Somnolent but waking up and answering questions     Labs on Admission: I have personally reviewed following labs and imaging studies  CBC: Recent Labs  Lab 08/26/20 2151  WBC 10.6*  NEUTROABS 5.3  HGB 15.7  HCT 50.4  MCV 87.2  PLT 194   Basic Metabolic Panel: Recent Labs  Lab 08/26/20 2151  NA 135  K 3.5  CL 101  CO2 23  GLUCOSE 138*  BUN 11  CREATININE 0.89  CALCIUM 8.6*  MG 2.0   GFR: Estimated Creatinine Clearance: 95.7 mL/min (by C-G formula based on SCr of 0.89 mg/dL). Liver Function Tests: Recent Labs  Lab 08/26/20 2151  AST 28  ALT 29  ALKPHOS 47  BILITOT 0.6  PROT 7.9  ALBUMIN 4.1   No results for input(s): LIPASE, AMYLASE in the last 168 hours. No results for input(s): AMMONIA in the last 168 hours. Coagulation Profile: No results for input(s): INR, PROTIME in the last 168 hours. Cardiac Enzymes: No results for input(s): CKTOTAL, CKMB, CKMBINDEX,  TROPONINI in the last 168 hours. BNP (last 3 results) No results for input(s): PROBNP in the last 8760 hours. HbA1C: No results for input(s): HGBA1C in the last 72 hours. CBG: Recent Labs  Lab 08/26/20 2239  GLUCAP 130*   Lipid Profile: No results for input(s): CHOL, HDL, LDLCALC, TRIG, CHOLHDL, LDLDIRECT in the last 72 hours. Thyroid Function Tests: No results for input(s): TSH, T4TOTAL, FREET4, T3FREE, THYROIDAB in the last 72 hours. Anemia Panel: No results for input(s): VITAMINB12, FOLATE, FERRITIN, TIBC, IRON, RETICCTPCT in the last  72 hours. Urine analysis:    Component Value Date/Time   COLORURINE YELLOW 08/27/2020 0021   APPEARANCEUR CLEAR 08/27/2020 0021   LABSPEC 1.010 08/27/2020 0021   PHURINE 5.0 08/27/2020 0021   GLUCOSEU NEGATIVE 08/27/2020 0021   HGBUR SMALL (A) 08/27/2020 0021   BILIRUBINUR NEGATIVE 08/27/2020 0021   KETONESUR NEGATIVE 08/27/2020 0021   PROTEINUR NEGATIVE 08/27/2020 0021   UROBILINOGEN 1.0 06/21/2007 2206   NITRITE NEGATIVE 08/27/2020 0021   LEUKOCYTESUR NEGATIVE 08/27/2020 0021    Radiological Exams on Admission: CT Head Wo Contrast  Result Date: 08/26/2020 CLINICAL DATA:  Seizure. EXAM: CT HEAD WITHOUT CONTRAST TECHNIQUE: Contiguous axial images were obtained from the base of the skull through the vertex without intravenous contrast. COMPARISON:  August 12, 2017 FINDINGS: Brain: No evidence of acute infarction, hemorrhage, hydrocephalus, extra-axial collection or mass lesion/mass effect. Vascular: No hyperdense vessel or unexpected calcification. Skull: Normal. Negative for fracture or focal lesion. Sinuses/Orbits: No acute finding. Prior scleral banding is seen on the left. Other: None. IMPRESSION: No acute intracranial abnormality. Electronically Signed   By: Aram Candela M.D.   On: 08/26/2020 22:18    EKG: Independently reviewed.  Initial EKG showing sinus rhythm.  Repeat EKG showing A. fib with RVR, QTC  543.  Assessment/Plan Principal Problem:   AMS (altered mental status) Active Problems:   Cigarette smoker   COPD GOLD 0 still smoking    Essential hypertension   Atrial fibrillation with rapid ventricular response (HCC)   Altered mental status/concern for seizures Patient had witnessed seizure-like activity at home after drinking heavily tonight to celebrate his birthday.  In the ED, he had additional episodes of seizure-like activity witnessed by ED provider where he would become unresponsive, tachycardic, and started having snoring respirations.  No significant electrolyte derangements on labs.  Blood ethanol level elevated, UDS negative.  Head CT negative for acute intracranial abnormality.  ED provider discussed the case with on-call neurologist who felt that this could be consistent with seizure-like activity. -Neurology will consult, would appreciate recommendations regarding antiepileptic therapy.  Order EEG and follow seizure precautions.  A. fib with RVR He is followed by cardiology for paroxysmal A. fib and currently on Cardizem.  Not on anticoagulation due to CHA2DS2VASc 1.  In the ED, was initially in sinus rhythm but later went into A. fib with rate 100-110.  Suspect precipitated by heavy alcohol consumption/binge drinking episode.  PE is a consideration given episodes of unresponsiveness witnessed by ED provider, however, less likely given no hypoxia. -Currently back in sinus rhythm.  Continue home Cardizem CD 120 mg and home Cardizem 30 mg every 4 hours as needed for Afib with HR >100.  Check D-dimer and TSH levels.  Acute hypoxemic respiratory failure Sats dropping to 91% on room air at rest.  At risk for aspiration given recent binge drinking and episodes of unresponsiveness. -Chest x-ray ordered.  Continuous pulse ox, supplemental oxygen.  Hypertension Stable at present. -Continue home Cardizem  COPD  Stable.  No wheezing or signs of acute exacerbation at this time.  No  inhalers listed in home medications. -DuoNeb as needed  Tobacco use -NicoDerm patch and counseling  QT prolongation EKG -Cardiac monitoring.  Monitor potassium and magnesium levels.  Avoid QT prolonging drugs if possible.  Repeat EKG in a.m.  DVT prophylaxis: Lovenox Code Status: Full code Family Communication: No family available at this time. Disposition Plan: Status is: Observation  The patient remains OBS appropriate and will d/c before 2 midnights.  Dispo: The  patient is from: Home              Anticipated d/c is to: Home              Anticipated d/c date is: 2 days              Patient currently is not medically stable to d/c.  The medical decision making on this patient was of high complexity and the patient is at high risk for clinical deterioration, therefore this is a level 3 visit.  John Giovanni MD [facility]  If 7PM-7AM, please contact night-coverage [email or web address]  08/27/2020, 2:20 AM

## 2020-08-27 NOTE — Discharge Instructions (Signed)
Shelly Flatten,  You were in the hospital because of a seizure. The neurologist evaluated you and started you on Depakote. Please follow-up with a neurologist as recommended   Per [facility] statutes, patients with seizures are not allowed to drive until  they have been seizure-free for six months. Use caution when using heavy equipment or power tools. Avoid working on ladders or at heights. Take showers instead of baths. Ensure the water temperature is not too high on the home water heater. Do not go swimming alone. When caring for infants or small children, sit down when holding, feeding, or changing them to minimize risk of injury to the child in the event you have a seizure.    Also, Maintain good sleep hygiene. Avoid alcohol.   --> Call 911 and bring the patient back to the ED if:               A.  The seizure lasts longer than 5 minutes.                  B.  The patient doesn't awaken shortly after the seizure             C.  The patient has new problems such as difficulty seeing, speaking or moving             D.  The patient was injured during the seizure             E.  The patient has a temperature over 102 F (39C)             F.  The patient vomited and now is having trouble breathing

## 2020-08-27 NOTE — Progress Notes (Signed)
Patient seen and examined at bedside, patient admitted after midnight, please see earlier detailed admission note by John Giovanni, MD. Briefly, patient presented secondary to altered mental status in setting of binge drinking in celebration of his birthday. There was associated concern for seizure activity as well and he was sent to the ED for evaluation.  BP 114/71   Pulse 77   Temp 97.6 F (36.4 C)   Resp (!) 29   Ht 5\' 10"  (1.778 m)   Wt 87.5 kg   SpO2 93%   BMI 27.68 kg/m   General exam: Appears calm and comfortable  Respiratory system: Clear to auscultation. Respiratory effort normal. Cardiovascular system: S1 & S2 heard, RRR. Gastrointestinal system: Abdomen is nondistended, soft and nontender. No organomegaly or masses felt. Normal bowel sounds heard. Central nervous system: Alert and oriented. No focal neurological deficits. Musculoskeletal: No edema. No calf tenderness Skin: No cyanosis. No rashes Psychiatry: Judgement and insight appear normal. Mood & affect appropriate.   A/P:  Metabolic encephalopathy Possible seizure From history, possibly alcohol intoxication vs possible associated alcohol induced seizure. Neurology consulted with recommendations for EEG and MRI. -MRI brain, EEG pending -Neurology recommendations today -Neuro checks -Telemetry  Paroxysmal atrial fibrillation with RVR Rapid rates in the ED prior to admission. Resolved with continuing home Cardizem.  Acute respiratory failure with hypoxia Secondary to lethargy. Resolved.  Disposition: anticipate discharge home in 24 hours pending neurology recommendations, MRI/EEG results and overnight monitoring on telemetry  , MD [facility] 08/27/2020, 10:50 AM

## 2020-08-27 NOTE — Discharge Summary (Addendum)
Physician Discharge Summary  [name] PJA:[id number] DOB: Apr 07, 1966 DOA: 08/26/2020  PCP: Grayce Sessions, NP  Admit date: 08/26/2020 Discharge date: 08/27/2020  Admitted From: Home Disposition: Home  Recommendations for Outpatient Follow-up:  1. Follow up with PCP in 1 week 2. Follow-up with Neurology outpatient 3. Please follow up on the following pending results: None  Home Health: None Equipment/Devices: None  Discharge Condition: Stable CODE STATUS: Full code Diet recommendation: Heart healthy   Brief/Interim Summary:  Admission HPI written by John Giovanni, MD   Chief Complaint: Seizures  HPI: [name] is a 54 y.o. male with medical history significant of paroxysmal A. 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  Patient is currently somnolent and postictal.  He does not recall what happened.  He has no complaints other than a cough.  Denies shortness of breath or chest pain.  History provided mostly by wife at bedside who states that this evening they had a party to celebrate the patient's birthday.  He was drinking very heavily and family then noticed that he had become unresponsive.  When his wife went to check on him he was in the chair with his eyes closed and was unresponsive.  No tongue biting or incontinence noted.  Wife states that the patient does not drink alcohol on a regular basis and consumes it only on occasion.  She is concerned that there were 2 people at the party who might have mixed something in his drink.  She does recall patient having a seizure about 5 years ago while at a skating ring and was evaluated in the ED at that time.  States they had recommended starting him on seizure medications but patient had refused.   Hospital course:  Metabolic encephalopathy Seizure Encephalopathy was likely post-ictal  state but possibly secondary to alcohol intoxication. Neurology consulted with recommendations for EEG and MRI. MRI brain was negative and EEG unable to be obtained. Neurology recommended Depakote DR 500 mg BID and outpatient neurology follow-up for EEG.  Paroxysmal atrial fibrillation with RVR Rapid rates in the ED prior to admission. Resolved with continuing home Cardizem.  Acute respiratory failure with hypoxia Secondary to lethargy. Resolved.  Discharge Diagnoses:  Principal Problem:   Seizure [facility]) Active Problems:   Cigarette smoker   COPD GOLD 0 still smoking    Essential hypertension   AMS (altered mental status)   Atrial fibrillation with rapid ventricular response (HCC)    Discharge Instructions   Allergies as of 08/27/2020   No Known Allergies     Medication List    TAKE these medications   diltiazem 120 MG 24 hr capsule Commonly known as: CARDIZEM CD TAKE 1 CAPSULE BY MOUTH EVERY DAY What changed:   how much to take  how to take this  when to take this  additional instructions   diltiazem 30 MG tablet Commonly known as: Cardizem Take 1 tablet every 4 hours AS NEEDED for AFIB heart rate >100   divalproex 500 MG DR tablet Commonly known as: DEPAKOTE Take 1 tablet (500 mg total) by mouth 2 (two) times daily.   lisinopril 20 MG tablet Commonly known as: ZESTRIL Take 1 tablet (20 mg total) by mouth daily.   sildenafil 50 MG tablet Commonly known as: VIAGRA TAKE 1 TABLET (50 MG TOTAL) BY MOUTH DAILY AS NEEDED FOR ERECTILE DYSFUNCTION.  No Known Allergies  Consultations:  Neurology   Procedures/Studies: CT Head Wo Contrast  Result Date: 08/26/2020 CLINICAL DATA:  Seizure. EXAM: CT HEAD WITHOUT CONTRAST TECHNIQUE: Contiguous axial images were obtained from the base of the skull through the vertex without intravenous contrast. COMPARISON:  August 12, 2017 FINDINGS: Brain: No evidence of acute infarction, hemorrhage, hydrocephalus,  extra-axial collection or mass lesion/mass effect. Vascular: No hyperdense vessel or unexpected calcification. Skull: Normal. Negative for fracture or focal lesion. Sinuses/Orbits: No acute finding. Prior scleral banding is seen on the left. Other: None. IMPRESSION: No acute intracranial abnormality. Electronically Signed   By: Aram Candela M.D.   On: 08/26/2020 22:18   MR BRAIN WO CONTRAST  Result Date: 08/27/2020 CLINICAL DATA:  Seizures EXAM: MRI HEAD WITHOUT CONTRAST TECHNIQUE: Multiplanar, multiecho pulse sequences of the brain and surrounding structures were obtained without intravenous contrast. COMPARISON:  2012 FINDINGS: Brain: There is no acute infarction or intracranial hemorrhage. There is no intracranial mass, mass effect, or edema. There is no hydrocephalus or extra-axial fluid collection. Small chronic left caudate infarct. Ventricles and sulci are in size and configuration. Vascular: Major vessel flow voids at the skull base are preserved. Skull and upper cervical spine: Normal marrow signal is preserved. Sinuses/Orbits: Minor mucosal thickening.  Left scleral banding. Other: Sella is unremarkable. Mild patchy mastoid fluid opacification. IMPRESSION: No acute infarction, hemorrhage, or mass. Electronically Signed   By: Guadlupe Spanish M.D.   On: 08/27/2020 12:49   DG CHEST PORT 1 VIEW  Result Date: 08/27/2020 CLINICAL DATA:  Hypoxia, seizure EXAM: PORTABLE CHEST 1 VIEW COMPARISON:  06/02/2018 FINDINGS: Mild right basilar atelectasis or scarring is present. The lungs are otherwise clear. No pneumothorax or pleural effusion. Cardiac size within normal limits. Pulmonary vascularity is normal. No acute bone abnormality. IMPRESSION: Minimal right basilar atelectasis or scarring. No radiographic evidence of acute cardiopulmonary disease. Electronically Signed   By: Helyn Numbers MD   On: 08/27/2020 03:26      Subjective: No issues this morning. No recollection of recent  events  Discharge Exam: Vitals:   08/27/20 1600 08/27/20 1630  BP:    Pulse: 77 78  Resp: 16 18  Temp:    SpO2: 96% 92%   Vitals:   08/27/20 1500 08/27/20 1530 08/27/20 1600 08/27/20 1630  BP:      Pulse: (!) 59 65 77 78  Resp: (!) 26 20 16 18   Temp:      TempSrc:      SpO2: 92% 93% 96% 92%  Weight:      Height:        General: Pt is alert, awake, not in acute distress Cardiovascular: RRR, S1/S2 +, no rubs, no gallops Respiratory: CTA bilaterally, no wheezing, no rhonchi Abdominal: Soft, NT, ND, bowel sounds + Extremities: no edema, no cyanosis    The results of significant diagnostics from this hospitalization (including imaging, microbiology, ancillary and laboratory) are listed below for reference.     Microbiology: Recent Results (from the past 240 hour(s))  Respiratory Panel by RT PCR (Flu A&B, Covid) - Nasopharyngeal Swab     Status: None   Collection Time: 08/27/20  8:00 AM   Specimen: Nasopharyngeal Swab  Result Value Ref Range Status   SARS Coronavirus 2 by RT PCR NEGATIVE NEGATIVE Final    Comment: (NOTE) SARS-CoV-2 target nucleic acids are NOT DETECTED.  The SARS-CoV-2 RNA is generally detectable in upper respiratoy specimens during the acute phase of infection. The lowest concentration of SARS-CoV-2 viral  copies this assay can detect is 131 copies/mL. A negative result does not preclude SARS-Cov-2 infection and should not be used as the sole basis for treatment or other patient management decisions. A negative result may occur with  improper specimen collection/handling, submission of specimen other than nasopharyngeal swab, presence of viral mutation(s) within the areas targeted by this assay, and inadequate number of viral copies (<131 copies/mL). A negative result must be combined with clinical observations, patient history, and epidemiological information. The expected result is Negative.  Fact Sheet for Patients:   https://www.moore.com/https://www.fda.gov/media/142436/download  Fact Sheet for Healthcare Providers:  https://www.young.biz/https://www.fda.gov/media/142435/download  This test is no t yet approved or cleared by the [place] FDA and  has been authorized for detection and/or diagnosis of SARS-CoV-2 by FDA under an Emergency Use Authorization (EUA). This EUA will remain  in effect (meaning this test can be used) for the duration of the COVID-19 declaration under Section 564(b)(1) of the Act, 21 U.S.C. section 360bbb-3(b)(1), unless the authorization is terminated or revoked sooner.     Influenza A by PCR NEGATIVE NEGATIVE Final   Influenza B by PCR NEGATIVE NEGATIVE Final    Comment: (NOTE) The Xpert Xpress SARS-CoV-2/FLU/RSV assay is intended as an aid in  the diagnosis of influenza from Nasopharyngeal swab specimens and  should not be used as a sole basis for treatment. Nasal washings and  aspirates are unacceptable for Xpert Xpress SARS-CoV-2/FLU/RSV  testing.  Fact Sheet for Patients: https://www.moore.com/https://www.fda.gov/media/142436/download  Fact Sheet for Healthcare Providers: https://www.young.biz/https://www.fda.gov/media/142435/download  This test is not yet approved or cleared by the [place] FDA and  has been authorized for detection and/or diagnosis of SARS-CoV-2 by  FDA under an Emergency Use Authorization (EUA). This EUA will remain  in effect (meaning this test can be used) for the duration of the  Covid-19 declaration under Section 564(b)(1) of the Act, 21  U.S.C. section 360bbb-3(b)(1), unless the authorization is  terminated or revoked. Performed at [facility], [address] [place], [place] [address]      Labs: BNP (last 3 results) No results for input(s): BNP in the last 8760 hours. Basic Metabolic Panel: Recent Labs  Lab 08/26/20 2151  NA 135  K 3.5  CL 101  CO2 23  GLUCOSE 138*  BUN 11  CREATININE 0.89  CALCIUM 8.6*  MG 2.0   Liver Function Tests: Recent Labs  Lab 08/26/20 2151  AST 28  ALT  29  ALKPHOS 47  BILITOT 0.6  PROT 7.9  ALBUMIN 4.1   No results for input(s): LIPASE, AMYLASE in the last 168 hours. No results for input(s): AMMONIA in the last 168 hours. CBC: Recent Labs  Lab 08/26/20 2151  WBC 10.6*  NEUTROABS 5.3  HGB 15.7  HCT 50.4  MCV 87.2  PLT 194   Cardiac Enzymes: No results for input(s): CKTOTAL, CKMB, CKMBINDEX, TROPONINI in the last 168 hours. BNP: Invalid input(s): POCBNP CBG: Recent Labs  Lab 08/26/20 2239  GLUCAP 130*   D-Dimer Recent Labs    08/27/20 0216  DDIMER 0.52*   Hgb A1c No results for input(s): HGBA1C in the last 72 hours. Lipid Profile No results for input(s): CHOL, HDL, LDLCALC, TRIG, CHOLHDL, LDLDIRECT in the last 72 hours. Thyroid function studies Recent Labs    08/27/20 0218  TSH 0.597   Anemia work up No results for input(s): VITAMINB12, FOLATE, FERRITIN, TIBC, IRON, RETICCTPCT in the last 72 hours. Urinalysis    Component Value Date/Time   COLORURINE YELLOW 08/27/2020 0021  APPEARANCEUR CLEAR 08/27/2020 0021   LABSPEC 1.010 08/27/2020 0021   PHURINE 5.0 08/27/2020 0021   GLUCOSEU NEGATIVE 08/27/2020 0021   HGBUR SMALL (A) 08/27/2020 0021   BILIRUBINUR NEGATIVE 08/27/2020 0021   KETONESUR NEGATIVE 08/27/2020 0021   PROTEINUR NEGATIVE 08/27/2020 0021   UROBILINOGEN 1.0 06/21/2007 2206   NITRITE NEGATIVE 08/27/2020 0021   LEUKOCYTESUR NEGATIVE 08/27/2020 0021   Sepsis Labs Invalid input(s): PROCALCITONIN,  WBC,  LACTICIDVEN Microbiology Recent Results (from the past 240 hour(s))  Respiratory Panel by RT PCR (Flu A&B, Covid) - Nasopharyngeal Swab     Status: None   Collection Time: 08/27/20  8:00 AM   Specimen: Nasopharyngeal Swab  Result Value Ref Range Status   SARS Coronavirus 2 by RT PCR NEGATIVE NEGATIVE Final    Comment: (NOTE) SARS-CoV-2 target nucleic acids are NOT DETECTED.  The SARS-CoV-2 RNA is generally detectable in upper respiratoy specimens during the acute phase of infection.  The lowest concentration of SARS-CoV-2 viral copies this assay can detect is 131 copies/mL. A negative result does not preclude SARS-Cov-2 infection and should not be used as the sole basis for treatment or other patient management decisions. A negative result may occur with  improper specimen collection/handling, submission of specimen other than nasopharyngeal swab, presence of viral mutation(s) within the areas targeted by this assay, and inadequate number of viral copies (<131 copies/mL). A negative result must be combined with clinical observations, patient history, and epidemiological information. The expected result is Negative.  Fact Sheet for Patients:  https://www.moore.com/  Fact Sheet for Healthcare Providers:  https://www.young.biz/  This test is no t yet approved or cleared by the [place] FDA and  has been authorized for detection and/or diagnosis of SARS-CoV-2 by FDA under an Emergency Use Authorization (EUA). This EUA will remain  in effect (meaning this test can be used) for the duration of the COVID-19 declaration under Section 564(b)(1) of the Act, 21 U.S.C. section 360bbb-3(b)(1), unless the authorization is terminated or revoked sooner.     Influenza A by PCR NEGATIVE NEGATIVE Final   Influenza B by PCR NEGATIVE NEGATIVE Final    Comment: (NOTE) The Xpert Xpress SARS-CoV-2/FLU/RSV assay is intended as an aid in  the diagnosis of influenza from Nasopharyngeal swab specimens and  should not be used as a sole basis for treatment. Nasal washings and  aspirates are unacceptable for Xpert Xpress SARS-CoV-2/FLU/RSV  testing.  Fact Sheet for Patients: https://www.moore.com/  Fact Sheet for Healthcare Providers: https://www.young.biz/  This test is not yet approved or cleared by the [place] FDA and  has been authorized for detection and/or diagnosis of SARS-CoV-2 by  FDA  under an Emergency Use Authorization (EUA). This EUA will remain  in effect (meaning this test can be used) for the duration of the  Covid-19 declaration under Section 564(b)(1) of the Act, 21  U.S.C. section 360bbb-3(b)(1), unless the authorization is  terminated or revoked. Performed at [facility], [address], [place], [place] [address]     SIGNED:   Jacquelin Hawking, MD [facility] 08/27/2020, 4:48 PM

## 2020-08-29 ENCOUNTER — Telehealth (INDEPENDENT_AMBULATORY_CARE_PROVIDER_SITE_OTHER): Payer: Self-pay | Admitting: Primary Care

## 2020-08-29 ENCOUNTER — Other Ambulatory Visit: Payer: Self-pay

## 2020-08-29 ENCOUNTER — Encounter (INDEPENDENT_AMBULATORY_CARE_PROVIDER_SITE_OTHER): Payer: Self-pay | Admitting: Primary Care

## 2020-08-29 DIAGNOSIS — R569 Unspecified convulsions: Secondary | ICD-10-CM

## 2020-08-29 DIAGNOSIS — Z09 Encounter for follow-up examination after completed treatment for conditions other than malignant neoplasm: Secondary | ICD-10-CM

## 2020-08-29 DIAGNOSIS — R059 Cough, unspecified: Secondary | ICD-10-CM

## 2020-08-29 DIAGNOSIS — I4891 Unspecified atrial fibrillation: Secondary | ICD-10-CM

## 2020-08-29 MED ORDER — FLUTICASONE PROPIONATE 50 MCG/ACT NA SUSP: 2.0000 | Freq: Every day | NASAL | 6 refills | Status: AC

## 2020-08-29 MED ORDER — CETIRIZINE HCL 10 MG PO TABS
10.0000 mg | ORAL_TABLET | Freq: Every day | ORAL | 11 refills | Status: AC
Start: 2020-08-29 — End: ?

## 2020-08-29 MED FILL — CETIRIZINE HCL 10 MG TABS: 10 | 30 days supply | Qty: 30 | Fill #0

## 2020-08-29 MED FILL — FLUTICASONE PROP 50 MCG SPR: 50 | 30 days supply | Qty: 16 | Fill #0

## 2020-08-29 NOTE — Progress Notes (Signed)
Virtual Visit via Telephone Note  I connected with [name] on 08/29/20 at  9:10 AM EDT by telephone and verified that I am speaking with the correct person using two identifiers.  Location: Patient: Home  Provider: Grayce Sessions @[facility]    I discussed the limitations, risks, security and privacy concerns of performing an evaluation and management service by telephone and the availability of in person appointments. I also discussed with the patient that there may be a patient responsible charge related to this service. The patient expressed understanding and agreed to proceed.  HPI  [name] is a 54 y.o.male having a  follow up from the hospital. Admit date to the hospital was 08/26/20, patient was discharged from the hospital on 08/27/20, patient was admitted for: Metabolic encephalopathy Seizure. Encephalopathy was likely post-ictal state but possibly secondary to alcohol intoxication  Past Medical History:  Diagnosis Date   Atrial fibrillation with RVR (HCC) 06/02/2018   Blind one eye    secondary to trauma as a child.  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 ) 90 capsule 3   divalproex (DEPAKOTE) 500 MG DR tablet Take 1 tablet (500 mg total) by mouth 2 (two) times daily. 60 tablet 0   lisinopril (ZESTRIL) 20 MG tablet Take 1 tablet (20 mg total) by mouth daily. 90 tablet 1   sildenafil (VIAGRA) 50 MG tablet TAKE 1 TABLET (50 MG TOTAL) BY MOUTH DAILY AS NEEDED FOR ERECTILE DYSFUNCTION. 10 tablet 1   No current facility-administered medications on file prior to visit.    ROS: Review of Systems  HENT: Positive for congestion.        Rhinitis   Respiratory: Positive for cough.    Ara  was seen today for hospitalization follow-up.  Diagnoses and all orders for this visit: Maxamus was seen today for hospitalization follow-up.  Diagnoses and all orders for this visit:  Atrial fibrillation, unspecified type [facility]) -     Ambulatory referral to Cardiology  Seizure [facility]) -     Ambulatory referral to Neurology  Cough -     May be associated with smoking 2 ppd and also having allergy symptoms discussed decreasing 2 ppd to 1 1/4 pack and each week or every 2 weeks continuing reducing.    Hospital discharge follow-up Retrieved from hospital discharge note 08/27/20 Recommendations for Outpatient Follow-up:  1. Follow up with PCP in 1 week 2. Follow-up with Neurology outpatient 3. Please follow up on the following pending results: None 4.  Other orders -     cetirizine (ZYRTEC) 10 MG tablet; Take 1 tablet (10 mg total) by mouth daily. -     fluticasone (FLONASE) 50 MCG/ACT nasal spray; Place 2 sprays into both nostrils daily.     I provided  20 minutes of non-face-to-face time during this encounter. Reviewed encounters , labs and imaging  08/29/20, NP

## 2020-08-31 ENCOUNTER — Telehealth: Payer: Self-pay | Admitting: Cardiology

## 2020-08-31 NOTE — Telephone Encounter (Signed)
Patient requesting to switch from Dr. Anne Fu to Dr. Rennis Golden.

## 2020-08-31 NOTE — Telephone Encounter (Signed)
Fine with me.  Dr Reniah Cottingham 

## 2020-09-01 NOTE — Telephone Encounter (Signed)
Ok with me Ashlley Booher, MD  

## 2020-09-05 ENCOUNTER — Other Ambulatory Visit (INDEPENDENT_AMBULATORY_CARE_PROVIDER_SITE_OTHER): Payer: Self-pay | Admitting: Primary Care

## 2020-09-05 DIAGNOSIS — Z760 Encounter for issue of repeat prescription: Secondary | ICD-10-CM

## 2020-09-05 MED ORDER — DILTIAZEM HCL ER COATED BEADS 120 MG PO CP24: ORAL_CAPSULE | ORAL | 0 refills | Status: DC

## 2020-09-05 MED ORDER — LISINOPRIL 20 MG PO TABS: 20.0000 mg | ORAL_TABLET | Freq: Every day | ORAL | 0 refills | Status: DC

## 2020-09-05 MED FILL — LISINOPRIL 20 MG TABLET: 20 | 30 days supply | Qty: 30 | Fill #0

## 2020-09-05 MED FILL — DILTIAZEM 24HR ER 120 MG CA: 120 | 30 days supply | Qty: 30 | Fill #0

## 2020-09-05 NOTE — Telephone Encounter (Signed)
Medication Refill - Medication: Lisinopril, diltiazem   Has the patient contacted their pharmacy? Yes.   Pt states that he is completely out of these medications. Please advise.  (Agent: If no, request that the patient contact the pharmacy for the refill.) (Agent: If yes, when and what did the pharmacy advise?)  Preferred Pharmacy (with phone number or street name):  [facility] [place], [place] - [address]  [address] [place] [address]  Phone: [phone] Fax: [phone]  Hours: Not open 24 hours     Agent: Please be advised that RX refills may take up to 3 business days. We ask that you follow-up with your pharmacy.

## 2020-09-26 ENCOUNTER — Ambulatory Visit: Payer: Self-pay | Admitting: Cardiology

## 2020-10-02 ENCOUNTER — Ambulatory Visit (INDEPENDENT_AMBULATORY_CARE_PROVIDER_SITE_OTHER): Payer: Self-pay | Admitting: Internal Medicine

## 2020-10-02 ENCOUNTER — Encounter: Payer: Self-pay | Admitting: Internal Medicine

## 2020-10-02 ENCOUNTER — Other Ambulatory Visit: Payer: Self-pay

## 2020-10-02 VITALS — BP 122/86 | HR 65 | Ht 70.0 in | Wt 189.8 lb

## 2020-10-02 DIAGNOSIS — I10 Essential (primary) hypertension: Secondary | ICD-10-CM

## 2020-10-02 DIAGNOSIS — F17200 Nicotine dependence, unspecified, uncomplicated: Secondary | ICD-10-CM

## 2020-10-02 DIAGNOSIS — I480 Paroxysmal atrial fibrillation: Secondary | ICD-10-CM

## 2020-10-02 DIAGNOSIS — I2510 Atherosclerotic heart disease of native coronary artery without angina pectoris: Secondary | ICD-10-CM

## 2020-10-02 DIAGNOSIS — I2584 Coronary atherosclerosis due to calcified coronary lesion: Secondary | ICD-10-CM

## 2020-10-02 DIAGNOSIS — R0602 Shortness of breath: Secondary | ICD-10-CM

## 2020-10-02 DIAGNOSIS — R5383 Other fatigue: Secondary | ICD-10-CM

## 2020-10-02 DIAGNOSIS — J449 Chronic obstructive pulmonary disease, unspecified: Secondary | ICD-10-CM

## 2020-10-02 NOTE — Progress Notes (Signed)
OFFICE NOTE  Chief Complaint:  Establish cardiologist  Primary Care Physician: Grayce Sessions, NP  HPI:  [name] is a 54 y.o. male with a past medial history significant for paroxysmal atrial fibrillation, tobacco abuse, hypertension and coronary artery calcification.  He was previously followed by Dr. Anne Fu however recently switched to my service.  More recently he was hospitalized with atrial fibrillation and seizures secondary to acute alcohol binge.  He reports that he does not typically drink but had drank heavily with a mixture of different alcohols at a birthday party and had had a seizure.  He was noted to be in A. fib with RVR initially however with Cardizem that resolved and has maintained sinus rhythm.  And in 2020 by Rudi Coco, NP in the A. fib clinic.  CHADVASC score was thought to be 1 and he is not anticoagulated.  In the past had been recommended for statin and low-dose aspirin therapy.  He also reports fatigue.  He says he does not sleep very much at night maybe 3 to 4 hours.  He wakes up intermittently.  He seems okay in the morning but then gets fatigued very easily during the day.  He works Tree surgeon.  His wife notes that he snores.  He is worse particularly on his right side but better somewhat on his left side.  He also coughs a lot.  With regards to tobacco abuse he smokes about 2 packs/day.  He is working to try to cut back.  He is previously seen Dr. Sherene Sires with pulmonary who had diagnosed him with COPD but felt that if he did not stop smoking there were little options for him.  CT angio of the chest in 2019 was notable for apical lung blebs and mild coronary artery calcification.  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  Coronary artery calcification was noted.  He denies any frank chest pain.  Blood pressure appears well controlled today on lisinopril.  PMHx:  Past Medical History:  Diagnosis Date  . Atrial  fibrillation with RVR (HCC) 06/02/2018  . 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 EYE SURGERY    . INGUINAL HERNIA REPAIR Right 1969  . RETINAL DETACHMENT SURGERY Left ~ 1974   "blind since"    FAMHx:  Family History  Problem Relation Age of Onset  . Healthy Father     SOCHx:   reports that he has been smoking cigarettes. He has a 64.50 pack-year smoking history. He has never used smokeless tobacco. He reports previous alcohol use. He reports previous drug use.  ALLERGIES:  No Known Allergies  ROS: Pertinent items noted in HPI and remainder of comprehensive ROS otherwise negative.  HOME MEDS: Current Outpatient Medications on File Prior to Visit  Medication Sig Dispense Refill  . cetirizine (ZYRTEC) 10 MG tablet Take 1 tablet (10 mg total) by mouth daily. 30 tablet 11  . diltiazem (CARDIZEM CD) 120 MG 24 hr capsule TAKE 1 CAPSULE BY MOUTH EVERY DAY 90 capsule 0  . divalproex (DEPAKOTE) 500 MG DR tablet Take 1 tablet (500 mg total) by mouth 2 (two) times daily. 60 tablet 0  . fluticasone (FLONASE) 50 MCG/ACT nasal spray Place 2 sprays into both nostrils daily. 16 g 6  . lisinopril (ZESTRIL) 20 MG  tablet Take 1 tablet (20 mg total) by mouth daily. 90 tablet 0  . sildenafil (VIAGRA) 50 MG tablet TAKE 1 TABLET (50 MG TOTAL) BY MOUTH DAILY AS NEEDED FOR ERECTILE DYSFUNCTION. 10 tablet 1   No current facility-administered medications on file prior to visit.    LABS/IMAGING: No results found for this or any previous visit (from the past 48 hour(s)). No results found.  LIPID PANEL:    Component Value Date/Time   CHOL 153 02/28/2020 1633   TRIG 137 02/28/2020 1633   HDL 32 (L) 02/28/2020 1633   CHOLHDL 4.8 02/28/2020 1633   LDLCALC 96 02/28/2020 1633     WEIGHTS: Wt Readings from Last 3 Encounters:  10/02/20 189 lb 12.8 oz (86.1 kg)   08/26/20 192 lb 14.4 oz (87.5 kg)  05/30/20 193 lb (87.5 kg)    VITALS: BP 122/86   Pulse 65   Ht 5\' 10"  (1.778 m)   Wt 189 lb 12.8 oz (86.1 kg)   BMI 27.23 kg/m   EXAM: General appearance: alert, fatigued and no distress Neck: no carotid bruit, no JVD and thyroid not enlarged, symmetric, no tenderness/mass/nodules Lungs: diminished breath sounds bilaterally Heart: regular rate and rhythm Abdomen: soft, non-tender; bowel sounds normal; no masses,  no organomegaly Extremities: extremities normal, atraumatic, no cyanosis or edema Pulses: 2+ and symmetric Skin: Skin color, texture, turgor normal. No rashes or lesions Neurologic: Grossly normal Psych: Pleasant  EKG: Normal sinus rhythm at 65- personally reviewed  ASSESSMENT: 1. PAF-CHADSVASC score 2 (hypertension, coronary artery calcification) 2. Possible obstructive sleep apnea-snoring, fatigue, nonrestorative sleep 3. Dyspnea with exertion 4. Tobacco abuse-2 pack/day, COPD with emphysema 5. Essential hypertension  PLAN: 1.   Mr. Ohms has paroxysmal atrial fibrillation.  He is in sinus rhythm today seems to be controlled with low-dose diltiazem.  I would recommend at least low-dose aspirin 81 mg daily for hypertension and he does have some coronary calcification but no noted aortic atherosclerosis.  With his dyspnea and fatigue as well as coronary calcification, would recommend a Lexiscan Myoview stress test to rule out any ischemia.  Blood pressure appears to be well controlled on current medications.  His lipids were reasonable with a total cholesterol 153, HDL 32, LDL 96 and triglycerides 137.  He is not on a statin.  Finally, I am concerned about possible obstructive sleep apnea.  Would recommend a home sleep study as there is a high prevalence of sleep apnea in a population of A. fib.  It does seem like alcohol may have been a trigger for his A. fib and I would certainly recommend avoiding that in the future as much as  possible.  Follow-up with me afterwards.  Margo Aye, MD, [facility], FACP  [place]  [facility]  Medical Director of the Advanced Lipid Disorders &  Cardiovascular Risk Reduction Clinic Diplomate of the American Board of Clinical Lipidology Attending Cardiologist  Direct Dial: [phone]  Fax: [phone]  Website:  [email or web address] Altin Sease 10/02/2020, 8:27 AM

## 2020-10-02 NOTE — Patient Instructions (Addendum)
Medication Instructions:  START aspirin 81mg  once daily CONTINUE all other current medications  *If you need a refill on your cardiac medications before your next appointment, please call your pharmacy*  Testing/Procedures: Dr. has ordered a HOME SLEEP TEST - you will be contacted to arrange your appointment once approved with insurance  Dr. Rennis Golden has ordered a Lexiscan Myocardial Perfusion Imaging Study. [address] - [address]  Please arrive 15 minutes prior to your appointment time for registration and insurance purposes.   The test will take approximately 3 to 4 hours to complete; you may bring reading material.  If someone comes with you to your appointment, they will need to remain in the main lobby due to limited space in the testing area. **If you are pregnant or breastfeeding, please notify the nuclear lab prior to your appointment**   How to prepare for your Myocardial Perfusion Test:  Do not eat or drink 3 hours prior to your test, except you may have water.  Do not consume products containing caffeine (regular or decaffeinated) 12 hours prior to your test. (ex: coffee, chocolate, sodas, tea).  Do wear comfortable clothes (no dresses or overalls) and walking shoes, tennis shoes preferred (No heels or open toe shoes are allowed).  Do NOT wear cologne, perfume, aftershave, or lotions (deodorant is allowed).  If you use an inhaler, use it the AM of your test and bring it with you.   If you use a nebulizer, use it the AM of your test.   If these instructions are not followed, your test will have to be rescheduled.   Follow-Up: At [facility], you and your health needs are our priority.  As part of our continuing mission to provide you with exceptional heart care, we have created designated Provider Care Teams.  These Care Teams include your primary Cardiologist (physician) and Advanced Practice Providers (APPs -  Physician Assistants and Nurse  Practitioners) who all work together to provide you with the care you need, when you need it.  We recommend signing up for the patient portal called "MyChart".  Sign up information is provided on this After Visit Summary.  MyChart is used to connect with patients for Virtual Visits (Telemedicine).  Patients are able to view lab/test results, encounter notes, upcoming appointments, etc.  Non-urgent messages can be sent to your provider as well.   To learn more about what you can do with MyChart, go to [facility].    Your next appointment:   1-2 months - after testing  The format for your next appointment:   In Person  Provider:   You may see Dr. [email or web address] or one of the following Advanced Practice Providers on your designated Care Team:    Rennis Golden, PA-C  Azalee Course, PA-C or   Micah Flesher, Judy Pimple    Other Instructions  You have been referred to Dr. [place] with pulmonary

## 2020-10-05 ENCOUNTER — Telehealth: Payer: Self-pay | Admitting: Internal Medicine

## 2020-10-05 NOTE — Telephone Encounter (Signed)
10/05/20 3:29pm LVM to see if patient wanted to schedule his Home Sleep Test, he would be paying out of pocket - LCN

## 2020-10-05 NOTE — Telephone Encounter (Signed)
Spoke with patient regarding appointment with [facility] (Dr. Sherene Sires) scheduled Wednesday 11/15/20 at 11:30 am---arrival time is 11:00 am  [address]--phone [phone].  Will mail information to patient and it is also in My Chart--patient voiced his understanding.

## 2020-10-10 ENCOUNTER — Telehealth (HOSPITAL_COMMUNITY): Payer: Self-pay | Admitting: *Deleted

## 2020-10-10 ENCOUNTER — Encounter (HOSPITAL_COMMUNITY): Payer: Self-pay | Admitting: *Deleted

## 2020-10-10 NOTE — Telephone Encounter (Signed)
Patient given detailed instructions per Myocardial Perfusion Study Information Sheet for the test on 10/13/20 at 7:30. Patient notified to arrive 15 minutes early and that it is imperative to arrive on time for appointment to keep from having the test rescheduled.  If you need to cancel or reschedule your appointment, please call the office within 24 hours of your appointment. . Patient verbalized understanding.Daneil Dolin

## 2020-10-13 ENCOUNTER — Ambulatory Visit (HOSPITAL_COMMUNITY): Payer: 59 | Attending: Cardiovascular Disease

## 2020-10-13 ENCOUNTER — Other Ambulatory Visit: Payer: Self-pay

## 2020-10-13 ENCOUNTER — Telehealth: Payer: Self-pay | Admitting: Internal Medicine

## 2020-10-13 DIAGNOSIS — F17200 Nicotine dependence, unspecified, uncomplicated: Secondary | ICD-10-CM | POA: Diagnosis present

## 2020-10-13 DIAGNOSIS — I2510 Atherosclerotic heart disease of native coronary artery without angina pectoris: Secondary | ICD-10-CM | POA: Diagnosis present

## 2020-10-13 DIAGNOSIS — R5383 Other fatigue: Secondary | ICD-10-CM | POA: Diagnosis present

## 2020-10-13 DIAGNOSIS — R0602 Shortness of breath: Secondary | ICD-10-CM | POA: Insufficient documentation

## 2020-10-13 DIAGNOSIS — I2584 Coronary atherosclerosis due to calcified coronary lesion: Secondary | ICD-10-CM

## 2020-10-13 LAB — MYOCARDIAL PERFUSION IMAGING
LV dias vol: 91 mL (ref 62–150)
LV sys vol: 44 mL
Peak HR: 106 {beats}/min
Rest HR: 68 {beats}/min
SDS: 0
SRS: 0
SSS: 0
TID: 1.06

## 2020-10-13 MED ORDER — TECHNETIUM TC 99M TETROFOSMIN IV KIT
10.2000 | PACK | Freq: Once | INTRAVENOUS | Status: AC | PRN
  Administered 2020-10-13: 10.2 via INTRAVENOUS
  Filled 2020-10-13: qty 11

## 2020-10-13 MED ORDER — REGADENOSON 0.4 MG/5ML IV SOLN
0.4000 mg | Freq: Once | INTRAVENOUS | Status: AC
  Administered 2020-10-13: 0.4 mg via INTRAVENOUS

## 2020-10-13 MED ORDER — TECHNETIUM TC 99M TETROFOSMIN IV KIT
30.6000 | PACK | Freq: Once | INTRAVENOUS | Status: AC | PRN
  Administered 2020-10-13: 30.6 via INTRAVENOUS
  Filled 2020-10-13: qty 31

## 2020-10-19 NOTE — Telephone Encounter (Signed)
10/19/20 11:34am AUTH Foye Spurling PENDED # [id number]

## 2020-11-01 NOTE — Telephone Encounter (Signed)
Auth Approved; case # [id number]; Valid through 11/11/20 - 01/09/21

## 2020-11-15 ENCOUNTER — Ambulatory Visit: Payer: Self-pay | Admitting: Internal Medicine

## 2020-11-20 MED FILL — LISINOPRIL 20 MG TABLET: 20 | 30 days supply | Qty: 30 | Fill #1

## 2020-11-20 MED FILL — DILTIAZEM 24HR ER 120 MG CA: 120 | 30 days supply | Qty: 30 | Fill #1

## 2020-11-23 ENCOUNTER — Telehealth: Payer: Self-pay | Admitting: Internal Medicine

## 2020-11-23 NOTE — Telephone Encounter (Signed)
Spoke with patient concerning the appointment scheduled 12/08/20 at [facility] with Dr. Rolan Bucco is [address], [address]--phone [phone].  Will mail information to patient and he voiced his understanding.

## 2020-11-24 ENCOUNTER — Encounter (HOSPITAL_BASED_OUTPATIENT_CLINIC_OR_DEPARTMENT_OTHER): Payer: 59 | Admitting: Cardiovascular Disease

## 2020-11-30 ENCOUNTER — Ambulatory Visit (INDEPENDENT_AMBULATORY_CARE_PROVIDER_SITE_OTHER): Payer: Self-pay | Admitting: Primary Care

## 2020-12-08 ENCOUNTER — Institutional Professional Consult (permissible substitution): Payer: 59 | Admitting: Pulmonary Disease

## 2020-12-21 ENCOUNTER — Ambulatory Visit: Payer: Self-pay | Admitting: Internal Medicine

## 2020-12-29 MED FILL — DILTIAZEM 24HR ER 120 MG CA: 120 | 30 days supply | Qty: 30 | Fill #2

## 2020-12-29 MED FILL — LISINOPRIL 20 MG TABLET: 20 | 30 days supply | Qty: 30 | Fill #2

## 2021-04-24 ENCOUNTER — Other Ambulatory Visit: Payer: Self-pay

## 2021-04-24 ENCOUNTER — Emergency Department (HOSPITAL_COMMUNITY): Payer: Self-pay

## 2021-04-24 ENCOUNTER — Emergency Department (HOSPITAL_COMMUNITY)
Admission: EM | Admit: 2021-04-24 | Discharge: 2021-04-24 | Disposition: A | Discharge status: Home / Self Care | Payer: Self-pay | Attending: Emergency Medicine | Admitting: Emergency Medicine

## 2021-04-24 ENCOUNTER — Encounter (HOSPITAL_COMMUNITY): Payer: Self-pay | Admitting: Emergency Medicine

## 2021-04-24 DIAGNOSIS — F17210 Nicotine dependence, cigarettes, uncomplicated: Secondary | ICD-10-CM | POA: Insufficient documentation

## 2021-04-24 DIAGNOSIS — Z79899 Other long term (current) drug therapy: Secondary | ICD-10-CM | POA: Insufficient documentation

## 2021-04-24 DIAGNOSIS — Z760 Encounter for issue of repeat prescription: Secondary | ICD-10-CM

## 2021-04-24 DIAGNOSIS — I10 Essential (primary) hypertension: Secondary | ICD-10-CM | POA: Insufficient documentation

## 2021-04-24 DIAGNOSIS — J449 Chronic obstructive pulmonary disease, unspecified: Secondary | ICD-10-CM | POA: Insufficient documentation

## 2021-04-24 DIAGNOSIS — R519 Headache, unspecified: Secondary | ICD-10-CM

## 2021-04-24 DIAGNOSIS — R0789 Other chest pain: Secondary | ICD-10-CM | POA: Insufficient documentation

## 2021-04-24 DIAGNOSIS — R079 Chest pain, unspecified: Secondary | ICD-10-CM

## 2021-04-24 DIAGNOSIS — R531 Weakness: Secondary | ICD-10-CM

## 2021-04-24 DIAGNOSIS — Z7982 Long term (current) use of aspirin: Secondary | ICD-10-CM | POA: Insufficient documentation

## 2021-04-24 LAB — COMPREHENSIVE METABOLIC PANEL
ALT: 24 U/L (ref 0–44)
AST: 24 U/L (ref 15–41)
Albumin: 3.6 g/dL (ref 3.5–5.0)
Alkaline Phosphatase: 43 U/L (ref 38–126)
Anion gap: 8 (ref 5–15)
BUN: 12 mg/dL (ref 6–20)
CO2: 27 mmol/L (ref 22–32)
Calcium: 8.9 mg/dL (ref 8.9–10.3)
Chloride: 101 mmol/L (ref 98–111)
Creatinine, Ser: 0.89 mg/dL (ref 0.61–1.24)
GFR, Estimated: >60 mL/min (ref >60)
Glucose, Bld: 96 mg/dL (ref 70–99)
Potassium: 3.8 mmol/L (ref 3.5–5.1)
Sodium: 136 mmol/L (ref 135–145)
Total Bilirubin: 0.6 mg/dL (ref 0.3–1.2)
Total Protein: 7 g/dL (ref 6.5–8.1)

## 2021-04-24 LAB — D-DIMER, QUANTITATIVE: D-Dimer, Quant: 0.53 ug/mL-FEU — ABNORMAL HIGH (ref 0.00–0.50)

## 2021-04-24 LAB — CBC WITH DIFFERENTIAL/PLATELET
Abs Immature Granulocytes: 0.05 10*3/uL (ref 0.00–0.07)
Basophils Absolute: 0.1 10*3/uL (ref 0.0–0.1)
Basophils Relative: 1 %
Eosinophils Absolute: 0.1 10*3/uL (ref 0.0–0.5)
Eosinophils Relative: 1 %
HCT: 47.5 % (ref 39.0–52.0)
Hemoglobin: 15.1 g/dL (ref 13.0–17.0)
Immature Granulocytes: 0 %
Lymphocytes Relative: 16 %
Lymphs Abs: 1.8 10*3/uL (ref 0.7–4.0)
MCH: 27.6 pg (ref 26.0–34.0)
MCHC: 31.8 g/dL (ref 30.0–36.0)
MCV: 86.8 fL (ref 80.0–100.0)
Monocytes Absolute: 1.1 10*3/uL — ABNORMAL HIGH (ref 0.1–1.0)
Monocytes Relative: 9 %
Neutro Abs: 8.7 10*3/uL — ABNORMAL HIGH (ref 1.7–7.7)
Neutrophils Relative %: 73 %
Platelets: 216 10*3/uL (ref 150–400)
RBC: 5.47 MIL/uL (ref 4.22–5.81)
RDW: 14 % (ref 11.5–15.5)
WBC: 11.8 10*3/uL — ABNORMAL HIGH (ref 4.0–10.5)
nRBC: 0 % (ref 0.0–0.2)

## 2021-04-24 LAB — TROPONIN I (HIGH SENSITIVITY)
Troponin I (High Sensitivity): 4 ng/L (ref <18)
Troponin I (High Sensitivity): 4 ng/L (ref <18)

## 2021-04-24 MED ORDER — DILTIAZEM HCL ER COATED BEADS 120 MG PO CP24: ORAL_CAPSULE | Freq: Every day | ORAL | 1 refills | Status: AC

## 2021-04-24 MED ORDER — LISINOPRIL 20 MG PO TABS: ORAL_TABLET | Freq: Every day | ORAL | 1 refills | Status: AC

## 2021-04-24 MED ORDER — METOCLOPRAMIDE HCL 5 MG/ML IJ SOLN
10.0000 mg | Freq: Once | INTRAMUSCULAR | Status: AC
  Administered 2021-04-24: 10 mg via INTRAVENOUS
  Filled 2021-04-24: qty 2

## 2021-04-24 MED ORDER — MORPHINE SULFATE (PF) 4 MG/ML IV SOLN
4.0000 mg | Freq: Once | INTRAVENOUS | Status: AC
  Administered 2021-04-24: 4 mg via INTRAVENOUS
  Filled 2021-04-24: qty 1

## 2021-04-24 NOTE — ED Triage Notes (Signed)
Patient brought in by EMS from home. Reports feeling weak and lethargic. C/o tingling in all extremities. Pt called daughter and she states patient speech was off, however not slurred.  Patient adds some central chest pain earlier this am but that has resolved. Equal grip strenth and sensation.

## 2021-04-24 NOTE — ED Notes (Signed)
Patient reports he has been out of his home medications for 2 days however was not taking medication correctly due to looking for a PCP since recent move.

## 2021-04-24 NOTE — ED Notes (Signed)
Patient ambulated to restroom with steady gait.

## 2021-04-24 NOTE — ED Provider Notes (Signed)
[facility] EMERGENCY DEPARTMENT Provider Note   CSN: [id number] Arrival date & time: 04/24/21  1131     History Chief Complaint  Patient presents with   Weakness    [name] is a 55 y.o. male.  Pt is a 54y/o male with hx of paroxysmal A. fib not on anticoagulation, hypertension, COPD, MAT, tobacco use who is presenting today with multiple complaints.  Patient reports he felt normal when he woke up this morning he had eaten some breakfast and he was at work.  He was sitting in his truck and he went to get out of his truck when he suddenly felt weak all over.  He reports his legs felt like Jell-O in his shoulders and if he had had his truck door to hold onto he would have fallen onto the ground.  Shortly within this timeframe he also developed a severe headache diffusely throughout his head and a tight sensation in his chest.  He always is short of breath which has been going on for some months but feels like it is a little bit worse right now.  He denies ever having symptoms like this before.  Patient does not have a PCP right now and has been stretching out his medications so is not taking them on a daily basis but more 2-3 times a week.  He last took medication yesterday.  He denies any drug use, no regular alcohol use but does continue to use cigarettes.  He has no prior history of heart attack or stents.  No visual changes, difficulty with speech or unilateral symptoms today.  The history is provided by the patient and medical records.  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   COCAINE DEPENDENCY NOS 12/25/2006   ALCOHOL ABUSE, UNSPECIFIED 12/25/2006   Cigarette smoker 12/25/2006   DEPRESSIVE DISORDER, NOS 12/25/2006    Past Surgical History:  Procedure Laterality Date   EYE SURGERY     INGUINAL HERNIA REPAIR Right 1969   RETINAL DETACHMENT SURGERY Left ~ 1974   "blind since"       Family History  Problem Relation Age of Onset   Healthy Father     Social History   Tobacco Use   Smoking status: Every Day    Packs/day: 1.50    Years: 43.00    Pack years: 64.50    Types: Cigarettes   Smokeless tobacco: Never  Vaping Use   Vaping Use: Former  Substance Use Topics   Alcohol use: Not Currently    Comment: 06/03/2018 "nothing in years"   Drug use: Not Currently    Comment: 06/03/2018 "nothing in years"    Home Medications Prior to Admission medications   Medication Sig Start Date End Date Taking? Authorizing Provider  aspirin EC 81 MG tablet Take 81 mg by mouth daily. Swallow whole.    [provider]  cetirizine (ZYRTEC) 10 MG tablet Take 1 tablet (10 mg total) by mouth daily. 08/29/20   Grayce Sessions, NP  diltiazem (CARDIZEM CD) 120 MG 24 hr capsule TAKE 1 CAPSULE BY MOUTH EVERY DAY 09/05/20 09/05/21  Grayce Sessions, NP  divalproex (DEPAKOTE) 500 MG DR tablet Take 1 tablet (500 mg total) by mouth 2 (two) times daily. 08/27/20   Narda Bonds, MD  fluticasone (FLONASE) 50 MCG/ACT nasal spray Place 2 sprays into both nostrils daily. 08/29/20   Grayce Sessions, NP  lisinopril (ZESTRIL) 20 MG tablet TAKE 1 TABLET (20 MG TOTAL) BY MOUTH DAILY. 09/05/20 09/05/21  Grayce Sessions, NP   sildenafil (VIAGRA) 50 MG tablet TAKE 1 TABLET (50 MG TOTAL) BY MOUTH DAILY AS NEEDED FOR ERECTILE DYSFUNCTION. 07/17/20   Grayce Sessions, NP    Allergies    Patient has no known allergies.  Review of Systems   Review of Systems  Neurological:  Positive for weakness.  All other systems reviewed and are negative.  Physical Exam Updated Vital Signs BP (!) 144/96 (BP Location: Left Arm)   Pulse 79   Temp (!) 97.4 F (36.3 C) (Oral)   Resp 18   Ht 5\' 10"  (1.778 m)   Wt 84.4 kg   SpO2 96%   BMI 26.69 kg/m   Physical Exam Vitals and nursing note reviewed.  Constitutional:      General: He is not in acute distress.    Appearance: He is well-developed.     Comments: Tearful on exam  HENT:     Head: Normocephalic and atraumatic.  Eyes:     Conjunctiva/sclera: Conjunctivae normal.     Pupils: Pupils are equal, round, and reactive to light.     Comments: Left eye is opacified and nonreactive.  Blinded from a childhood trauma.  Cardiovascular:     Rate and Rhythm: Normal rate and regular rhythm.     Pulses: Normal pulses.     Heart sounds: No murmur heard. Pulmonary:     Effort: Pulmonary effort is normal. No respiratory distress.     Breath sounds: Normal breath sounds. No wheezing or rales.  Abdominal:     General: There is no distension.     Palpations: Abdomen is soft.     Tenderness: There is no abdominal tenderness. There is no guarding or rebound.  Musculoskeletal:        General: No tenderness. Normal range of motion.     Cervical back: Normal range of motion and neck supple.     Right lower leg: No edema.     Left lower leg: No edema.  Skin:    General: Skin is warm and dry.     Capillary Refill: Capillary refill takes less than 2 seconds.     Findings: No erythema or rash.  Neurological:     Mental Status: He is alert and oriented to person, place, and time. Mental status is at baseline.     Cranial Nerves: No cranial nerve deficit.     Sensory: No  sensory deficit.     Motor: No weakness.     Coordination: Coordination normal.     Comments: Gait not tested at this time as patient is complaining of feeling weak  Psychiatric:        Mood and Affect: Mood normal.  Behavior: Behavior normal.    ED Results / Procedures / Treatments   Labs (all labs ordered are listed, but only abnormal results are displayed) Labs Reviewed  CBC WITH DIFFERENTIAL/PLATELET - Abnormal; Notable for the following components:      Result Value   WBC 11.8 (*)    Neutro Abs 8.7 (*)    Monocytes Absolute 1.1 (*)    All other components within normal limits  D-DIMER, QUANTITATIVE - Abnormal; Notable for the following components:   D-Dimer, Quant 0.53 (*)    All other components within normal limits  COMPREHENSIVE METABOLIC PANEL  TROPONIN I (HIGH SENSITIVITY)  TROPONIN I (HIGH SENSITIVITY)    EKG EKG Interpretation  Date/Time:  Tuesday April 24 2021 11:34:18 EDT Ventricular Rate:  79 PR Interval:  164 QRS Duration: 85 QT Interval:  366 QTC Calculation: 420 R Axis:   4 Text Interpretation: Sinus rhythm Anterior infarct, old No significant change since last tracing Confirmed by Gwyneth Sprout ([id number]) on 04/24/2021 11:54:49 AM  Radiology CT Head Wo Contrast  Result Date: 04/24/2021 CLINICAL DATA:  Headache. EXAM: CT HEAD WITHOUT CONTRAST TECHNIQUE: Contiguous axial images were obtained from the base of the skull through the vertex without intravenous contrast. COMPARISON:  08/26/2020 FINDINGS: Brain: There is no evidence for acute hemorrhage, hydrocephalus, mass lesion, or abnormal extra-axial fluid collection. No definite CT evidence for acute infarction. Vascular: No hyperdense vessel or unexpected calcification. Skull: No evidence for fracture. No worrisome lytic or sclerotic lesion. Sinuses/Orbits: The visualized paranasal sinuses and mastoid air cells are clear. Surgical changes noted in the left globe. Other: None. IMPRESSION: No acute  intracranial abnormality. Electronically Signed   By: Kennith Center M.D.   On: 04/24/2021 14:25   DG Chest Port 1 View  Result Date: 04/24/2021 CLINICAL DATA:  Chest pain EXAM: PORTABLE CHEST 1 VIEW COMPARISON:  08/27/2020 FINDINGS: The heart size and mediastinal contours are within normal limits. Both lungs are clear. The visualized skeletal structures are unremarkable. IMPRESSION: No acute abnormality of the lungs in AP portable projection. Electronically Signed   By: Lauralyn Primes M.D.   On: 04/24/2021 14:10    Procedures Procedures   Medications Ordered in ED Medications  metoCLOPramide (REGLAN) injection 10 mg (has no administration in time range)  morphine 4 MG/ML injection 4 mg (has no administration in time range)    ED Course  I have reviewed the triage vital signs and the nursing notes.  Pertinent labs & imaging results that were available during my care of the patient were reviewed by me and considered in my medical decision making (see chart for details).    MDM Rules/Calculators/A&P                          Patient is a 55 year old male with multiple risk factors presenting today with sudden onset of generalized weakness, severe headache and chest discomfort.  Patient has been taking his medications intermittently to stretch them out as he does not have a PCP at this time.  He is complaining of some chest discomfort currently and a headache.  He does not typically get headaches or chest pain.  He denies any dizziness or near syncope.  However still feels that his arms and legs feel heavy.  Patient has equal pulses throughout.  He has no abdominal pain.  He is speaking in full sentences and satting 96% on room air.  Given patient's history of hypertension, intermittent compliance with medication sudden  onset of the headache and the chest discomfort today concern for possible dissection versus subarachnoid hemorrhage versus near syncope.  Low risk Wells criteria but patient is  greater than 50 and also uses tobacco products.  Low suspicion for intra-abdominal pathology.  Will give patient headache medications and pain control.  CT of the head to rule out subarachnoid hemorrhage as his headache has been within the last hour.  No focal stroke findings at this time.  Troponins are pending.  2:51 PM On repeat evaluation patient is feeling better.  Head CT is negative for any acute pathology with low suspicion at this time for subarachnoid hemorrhage as he presented within 6 hours of having the headache.  Troponin is within normal limits, CBC, CMP are within normal limits, D-dimer age-adjusted is normal.  AG is within normal limits.  Chest x-ray without acute findings.  Patient's pain started 3 hours prior to arrival did not feel that he needs a repeat troponin.  Patient is requesting refills on his medications for his blood pressure.  He is currently in the process of getting a PCP.  Patient's daughter is present as well.  Their questions were answered.  He otherwise seems reasonable for discharge.  Question whether patient's symptoms today were related to possible seizure activity.  He has been on Depakote in the past but stopped taking it because he did not like the way it made him feel.  He reported being very irritable while on it.  Did encourage neurology follow-up.  Pt improved after headache cocktail.  MDM   Amount and/or Complexity of Data Reviewed Clinical lab tests: ordered and reviewed Tests in the radiology section of CPT: ordered Tests in the medicine section of CPT: ordered and reviewed Decide to obtain previous medical records or to obtain history from someone other than the patient: yes Obtain history from someone other than the patient: yes Review and summarize past medical records: yes Discuss the patient with other providers: no Independent visualization of images, tracings, or specimens: yes  Risk of Complications, Morbidity, and/or Mortality Presenting  problems: moderate Diagnostic procedures: moderate Management options: moderate  Patient Progress Patient progress: stable   Final Clinical Impression(s) / ED Diagnoses Final diagnoses:  Medication refill  Weakness  Acute nonintractable headache, unspecified headache type  Nonspecific chest pain    Rx / DC Orders ED Discharge Orders          Ordered    diltiazem (CARDIZEM CD) 120 MG 24 hr capsule  Daily        04/24/21 1454    lisinopril (ZESTRIL) 20 MG tablet  Daily        04/24/21 1454             Gwyneth SproutPlunkett, Aylissa Heinemann, MD 04/24/21 1455

## 2021-04-25 ENCOUNTER — Telehealth: Payer: Self-pay | Admitting: Nurse Practitioner

## 2021-04-25 NOTE — Telephone Encounter (Signed)
Mailbox was full could not leave a message  

## 2021-12-18 ENCOUNTER — Other Ambulatory Visit: Payer: Self-pay

## 2021-12-18 ENCOUNTER — Emergency Department (HOSPITAL_COMMUNITY): Payer: Self-pay

## 2021-12-18 ENCOUNTER — Emergency Department (HOSPITAL_COMMUNITY)
Admission: EM | Admit: 2021-12-18 | Discharge: 2021-12-18 | Disposition: A | Discharge status: Home / Self Care | Payer: Self-pay | Attending: Emergency Medicine | Admitting: Emergency Medicine

## 2021-12-18 ENCOUNTER — Encounter (HOSPITAL_COMMUNITY): Payer: Self-pay | Admitting: Emergency Medicine

## 2021-12-18 DIAGNOSIS — I4891 Unspecified atrial fibrillation: Secondary | ICD-10-CM | POA: Insufficient documentation

## 2021-12-18 DIAGNOSIS — U071 COVID-19: Secondary | ICD-10-CM | POA: Insufficient documentation

## 2021-12-18 DIAGNOSIS — Z28310 Unvaccinated for covid-19: Secondary | ICD-10-CM | POA: Insufficient documentation

## 2021-12-18 DIAGNOSIS — I10 Essential (primary) hypertension: Secondary | ICD-10-CM | POA: Insufficient documentation

## 2021-12-18 DIAGNOSIS — Z79899 Other long term (current) drug therapy: Secondary | ICD-10-CM | POA: Insufficient documentation

## 2021-12-18 DIAGNOSIS — Z7982 Long term (current) use of aspirin: Secondary | ICD-10-CM | POA: Insufficient documentation

## 2021-12-18 LAB — BASIC METABOLIC PANEL
Anion gap: 8 (ref 5–15)
BUN: 15 mg/dL (ref 6–20)
CO2: 28 mmol/L (ref 22–32)
Calcium: 8.8 mg/dL — ABNORMAL LOW (ref 8.9–10.3)
Chloride: 99 mmol/L (ref 98–111)
Creatinine, Ser: 0.98 mg/dL (ref 0.61–1.24)
GFR, Estimated: >60 mL/min (ref >60)
Glucose, Bld: 106 mg/dL — ABNORMAL HIGH (ref 70–99)
Potassium: 4.3 mmol/L (ref 3.5–5.1)
Sodium: 135 mmol/L (ref 135–145)

## 2021-12-18 LAB — CBC
HCT: 50.9 % (ref 39.0–52.0)
Hemoglobin: 16.5 g/dL (ref 13.0–17.0)
MCH: 28.1 pg (ref 26.0–34.0)
MCHC: 32.4 g/dL (ref 30.0–36.0)
MCV: 86.6 fL (ref 80.0–100.0)
Platelets: 180 10*3/uL (ref 150–400)
RBC: 5.88 MIL/uL — ABNORMAL HIGH (ref 4.22–5.81)
RDW: 13.8 % (ref 11.5–15.5)
WBC: 6.4 10*3/uL (ref 4.0–10.5)
nRBC: 0 % (ref 0.0–0.2)

## 2021-12-18 LAB — RESP PANEL BY RT-PCR (FLU A&B, COVID) ARPGX2
Influenza A by PCR: NEGATIVE
Influenza B by PCR: NEGATIVE
SARS Coronavirus 2 by RT PCR: POSITIVE — AB

## 2021-12-18 LAB — TROPONIN I (HIGH SENSITIVITY)
Troponin I (High Sensitivity): 3 ng/L (ref <18)
Troponin I (High Sensitivity): 4 ng/L (ref <18)

## 2021-12-18 MED ORDER — ACETAMINOPHEN 325 MG PO TABS: 650.0000 mg | ORAL_TABLET | Freq: Once | ORAL | Status: DC

## 2021-12-18 NOTE — ED Provider Notes (Addendum)
[facility] EMERGENCY DEPARTMENT Provider Note   CSN: [id number] Arrival date & time: 12/18/21  1025     History  Chief Complaint  Patient presents with   Generalized Body Aches    [name] is a 56 y.o. male.  56 y.o male with a PMH of Afib, HTN presents to the ED chief complaint of generalized body aches x a few days.  Patient reports feeling overall rundown, like he has no energy for anything.  He is also endorsing a subjective fever in the last couple days, he has had some decrease in p.o. intake.  He is currently taking lisinopril along with Cardizem but has not taken them in the last few days due to having feeling under the weather.  He does endorse tobacco use, smoking about a pack a day.  Not been around anybody that is been sick.  Denies any prior COVID-19, influenza vaccinations.No chest pain or shortness of breath.   The history is provided by the patient and medical records.      Home Medications Prior to Admission medications   Medication Sig Start Date End Date Taking? Authorizing Provider  aspirin EC 81 MG tablet Take 81 mg by mouth daily. Swallow whole.    [provider]  cetirizine (ZYRTEC) 10 MG tablet Take 1 tablet (10 mg total) by mouth daily. 08/29/20   Grayce Sessions, NP  diltiazem (CARDIZEM CD) 120 MG 24 hr capsule TAKE 1 CAPSULE BY MOUTH EVERY DAY 04/24/21 04/24/22  Gwyneth Sprout, MD  divalproex (DEPAKOTE) 500 MG DR tablet Take 1 tablet (500 mg total) by mouth 2 (two) times daily. 08/27/20   Narda Bonds, MD  fluticasone (FLONASE) 50 MCG/ACT nasal spray Place 2 sprays into both nostrils daily. 08/29/20   Grayce Sessions, NP  lisinopril (ZESTRIL) 20 MG tablet TAKE 1 TABLET (20 MG TOTAL) BY MOUTH DAILY. 04/24/21 04/24/22  Gwyneth Sprout, MD  sildenafil (VIAGRA) 50 MG tablet TAKE 1 TABLET (50 MG TOTAL) BY MOUTH DAILY AS NEEDED FOR ERECTILE DYSFUNCTION. 07/17/20   Grayce Sessions, NP      Allergies    Patient has  no known allergies.    Review of Systems   Review of Systems  Constitutional:  Positive for chills. Negative for fever.  HENT:  Negative for sore throat.   Respiratory:  Negative for shortness of breath.   Cardiovascular:  Negative for chest pain.  Gastrointestinal:  Negative for abdominal pain, nausea and vomiting.  Genitourinary:  Negative for flank pain.  Musculoskeletal:  Negative for back pain.  Neurological:  Positive for weakness. Negative for light-headedness and headaches.  All other systems reviewed and are negative.  Physical Exam Updated Vital Signs BP 128/90    Pulse 81    Temp 98.4 F (36.9 C) (Oral)    Resp (!) 21    SpO2 95%  Physical Exam Vitals and nursing note reviewed.  Constitutional:      Appearance: Normal appearance.  HENT:     Head: Normocephalic and atraumatic.     Mouth/Throat:     Mouth: Mucous membranes are moist.  Cardiovascular:     Rate and Rhythm: Normal rate.  Pulmonary:     Effort: Pulmonary effort is normal.     Breath sounds: Examination of the right-upper field reveals decreased breath sounds. Examination of the right-middle field reveals decreased breath sounds. Decreased breath sounds present. No wheezing.  Abdominal:     General: Abdomen is flat.  Skin:  General: Skin is warm and dry.  Neurological:     Mental Status: He is alert and oriented to person, place, and time.    ED Results / Procedures / Treatments   Labs (all labs ordered are listed, but only abnormal results are displayed) Labs Reviewed  RESP PANEL BY RT-PCR (FLU A&B, COVID) ARPGX2 - Abnormal; Notable for the following components:      Result Value   SARS Coronavirus 2 by RT PCR POSITIVE (*)    All other components within normal limits  BASIC METABOLIC PANEL - Abnormal; Notable for the following components:   Glucose, Bld 106 (*)    Calcium 8.8 (*)    All other components within normal limits  CBC - Abnormal; Notable for the following components:   RBC 5.88  (*)    All other components within normal limits  TROPONIN I (HIGH SENSITIVITY)  TROPONIN I (HIGH SENSITIVITY)    EKG EKG Interpretation  Date/Time:  Tuesday December 18 2021 10:31:04 EST Ventricular Rate:  90 PR Interval:  150 QRS Duration: 86 QT Interval:  360 QTC Calculation: 440 R Axis:   -27 Text Interpretation: Normal sinus rhythm Low voltage QRS Cannot rule out Anteroseptal infarct , age undetermined No significant change since last tracing When compared with ECG of 24-Apr-2021 11:34, PREVIOUS ECG IS PRESENT Confirmed by Gwyneth Sprout ([id number]) on 12/18/2021 11:16:43 AM  Radiology DG Chest 2 View  Result Date: 12/18/2021 CLINICAL DATA:  Chest pain and fever EXAM: CHEST - 2 VIEW COMPARISON:  04/24/2021 FINDINGS: The heart size and mediastinal contours are within normal limits. Both lungs are clear. The visualized skeletal structures are unremarkable. IMPRESSION: No active cardiopulmonary disease. Electronically Signed   By: Marlan Palau M.D.   On: 12/18/2021 11:02    Procedures Procedures    Medications Ordered in ED Medications  acetaminophen (TYLENOL) tablet 650 mg (has no administration in time range)    ED Course/ Medical Decision Making/ A&P Clinical Course as of 12/18/21 1330  Tue Dec 18, 2021  1141 SARS Coronavirus 2 by RT PCR(!): POSITIVE [JS]  1304 Troponin I (High Sensitivity): 3 [JS]    Clinical Course User Index [JS] Claude Manges, PA-C                           Medical Decision Making Amount and/or Complexity of Data Reviewed Labs: ordered. Decision-making details documented in ED Course. Radiology: ordered.  Risk OTC drugs.   This patient presents to the ED for concern of weakness, generalized body aches, this involves a number of treatment options, and is a complaint that carries with it a high risk of complications and morbidity.  The differential diagnosis includes Covid 19 infection, electrolyte derangement, symptomatic Afib.    Co  morbidities: Discussed in HPI   Brief History:  Patient with ongoing history of A-fib presents to the ED with generalized body aches, feeling overall fatigued and weak.  Noncompliance with medication in the last couple days as he reports decrease in appetite.  Chest pain, no shortness of breath, no abdominal pain.  EMR reviewed including pt PMHx, past surgical history and past visits to ER.   See HPI for more details   Lab Tests:  I ordered and independently interpreted labs.  The pertinent results include:    I personally reviewed all laboratory work and imaging. Metabolic panel without any acute abnormality specifically kidney function within normal limits and no significant electrolyte abnormalities. CBC without leukocytosis  or significant anemia. First troponin is 3, I have a lower suspicion for ACS as patient's symptoms seem to be more consistent with viral infection.  Second troponin is also unchanged.   Imaging Studies:  NAD. I personally reviewed all imaging studies and no acute abnormality found. I agree with radiology interpretation.    Cardiac Monitoring:  The patient was maintained on a cardiac monitor.  I personally viewed and interpreted the cardiac monitored which showed an underlying rhythm of: NSR EKG non-ischemic    Medicines ordered:  I ordered medication including tylenol  for body  aches Reevaluation of the patient after these medicines showed that the patient improved I have reviewed the patients home medicines and have made adjustments as needed  Reevaluation:  After the interventions noted above I re-evaluated patient and found that they have :improved   Social Determinants of Health:  The patient's social determinants of health were a factor in the care of this patient    Problem List / ED Course:  Patient here with COVID-19 infection, hemodynamically stable.  Smokes 1 pack a day.  Given Tylenol for body aches.  Here without chest pain or  shortness of breath.  Physical exam is unremarkable.  I have a lower suspicion for ACS at this time with patient being no chest pain, no shortness of breath.   Dispostion:  After consideration of the diagnostic results and the patients response to treatment, I feel that the patent would benefit from outpatient disposition.  Hemodynamically stable, not hypoxic or tachycardic.  We discussed isolation policy per CDC.  He is agreeable with plan and treatment, stable for discharge.   Portions of this note were generated with Scientist, clinical (histocompatibility and immunogenetics). Dictation errors may occur despite best attempts at proofreading.   Final Clinical Impression(s) / ED Diagnoses Final diagnoses:  COVID-19 virus infection    Rx / DC Orders ED Discharge Orders     None         Claude Manges, PA-C 12/18/21 1330    Claude Manges, PA-C 12/18/21 1331    Gwyneth Sprout, MD 12/21/21 [phone]

## 2021-12-18 NOTE — ED Triage Notes (Signed)
Patient here with complaint of generalized body aches and fever for the last few days. Patient also states he has not taken his lisinopril and cardizem in a few days because he hasn't felt well, also reports intermittently feeling like his heart is "pounding out of his chest". Patient alert, oriented, afebrile in triage, ambulatory, and in no apparent distress at this time.

## 2021-12-18 NOTE — Discharge Instructions (Signed)
You tested positive for COVID-19 infection on today's visit.  Please continue to isolate for the upcoming 5 days, following those 5 days you will need to wear a mask around everyone around all times.  If you experience any chest pain, shortness of breath or worsening symptoms please return to the emergency department.

## 2021-12-18 NOTE — ED Notes (Signed)
Patient transported to X-ray 

## 2023-05-21 IMAGING — DX DG CHEST 1V PORT
1 series · 1 of 1 positions shown · non-contrast
Comparison: 08/27/2020

CLINICAL DATA: Chest pain

EXAM:
PORTABLE CHEST 1 VIEW

[chest ap]
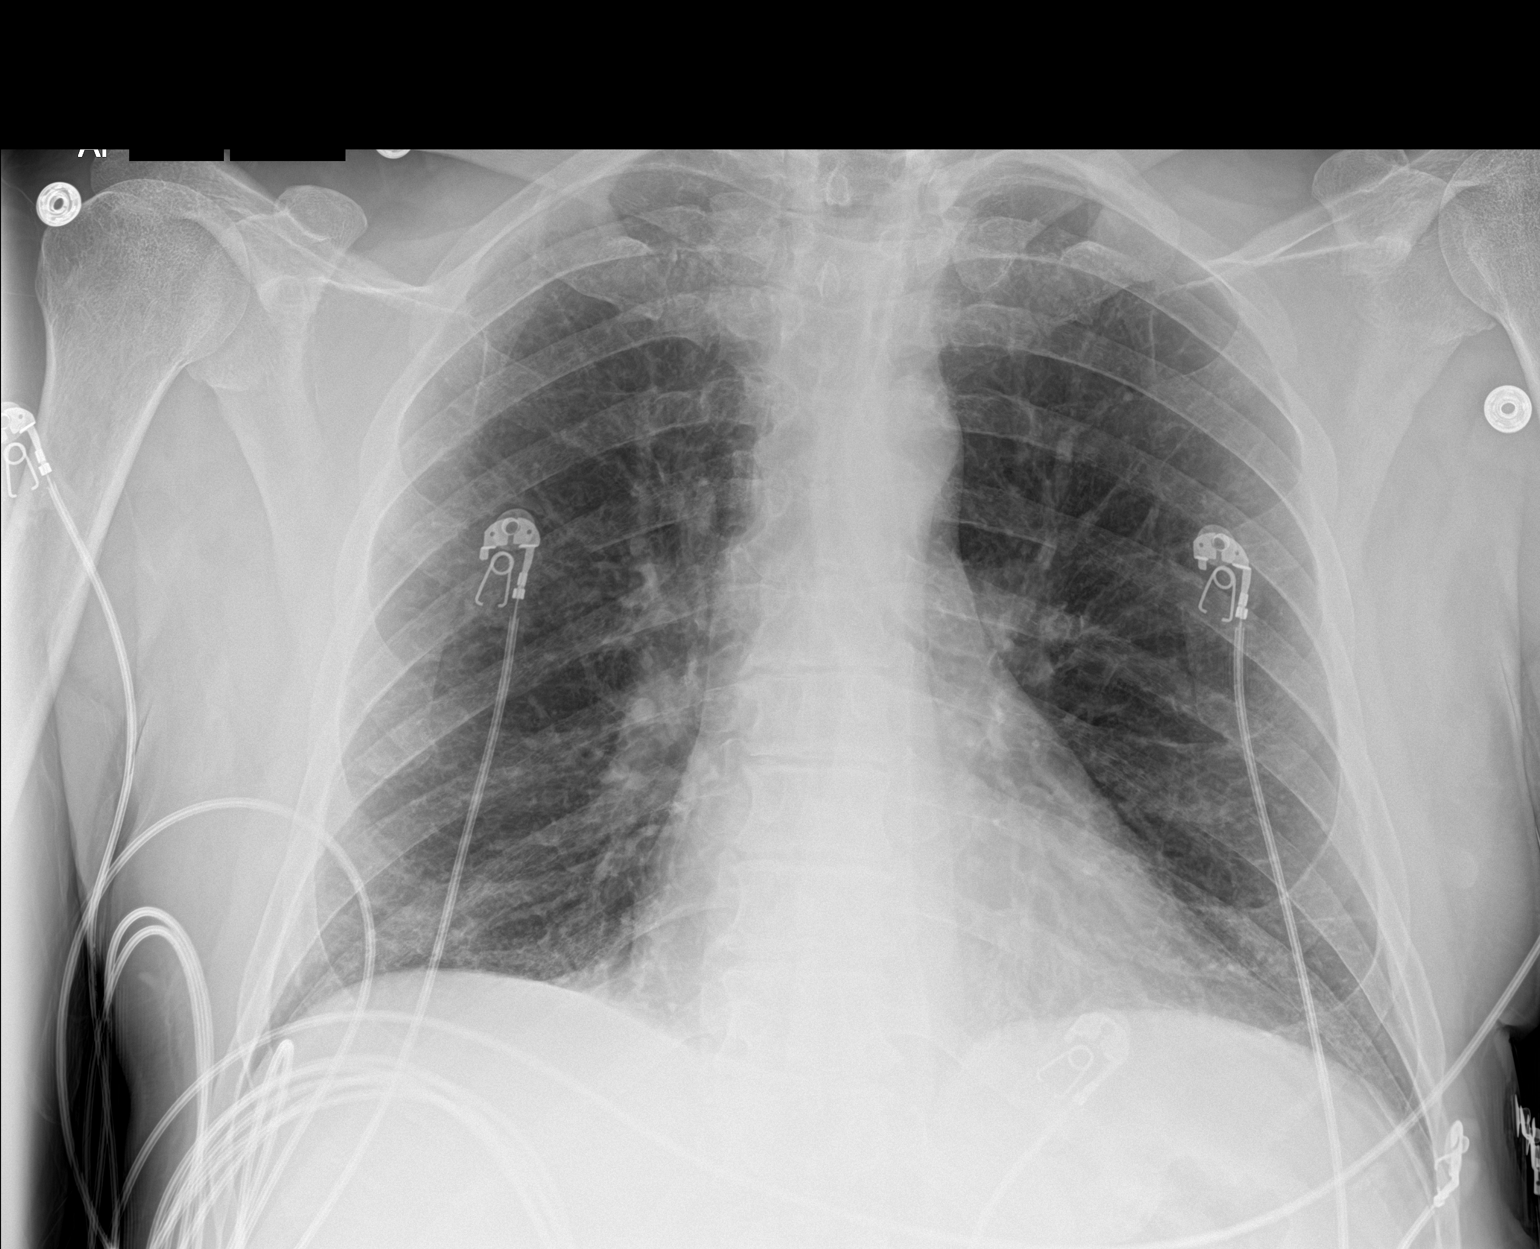

[1 of 1 positions shown; findings below may reference images not displayed]

FINDINGS: The heart size and mediastinal contours are within normal limits.
Both lungs are clear. The visualized skeletal structures are
unremarkable.
IMPRESSION: No acute abnormality of the lungs in AP portable projection.

## 2024-01-14 IMAGING — DX DG CHEST 2V
2 series · 2 of 2 positions shown · non-contrast
Comparison: 04/24/2021

CLINICAL DATA: Chest pain and fever

EXAM:
CHEST - 2 VIEW

[chest pa]
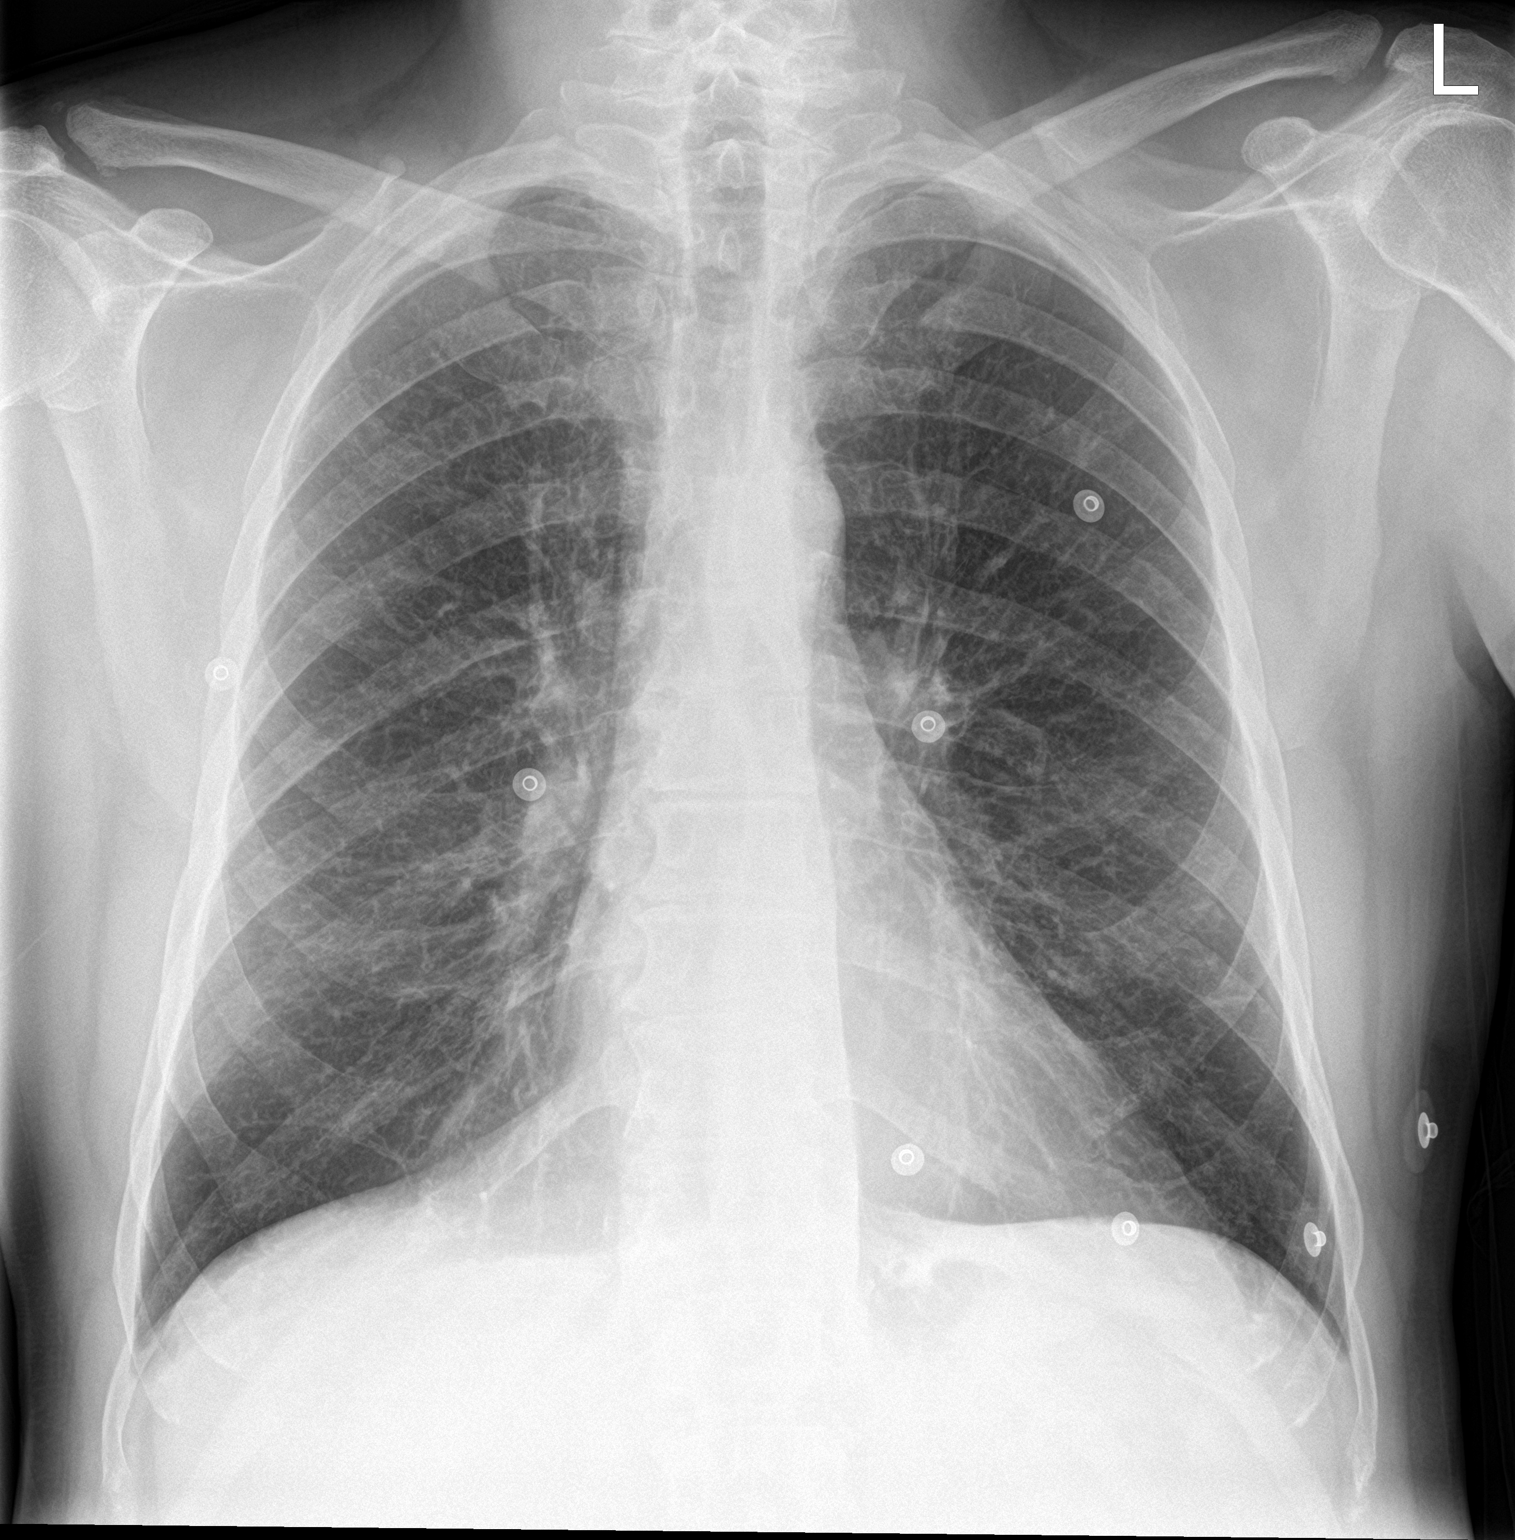

[chest lat]
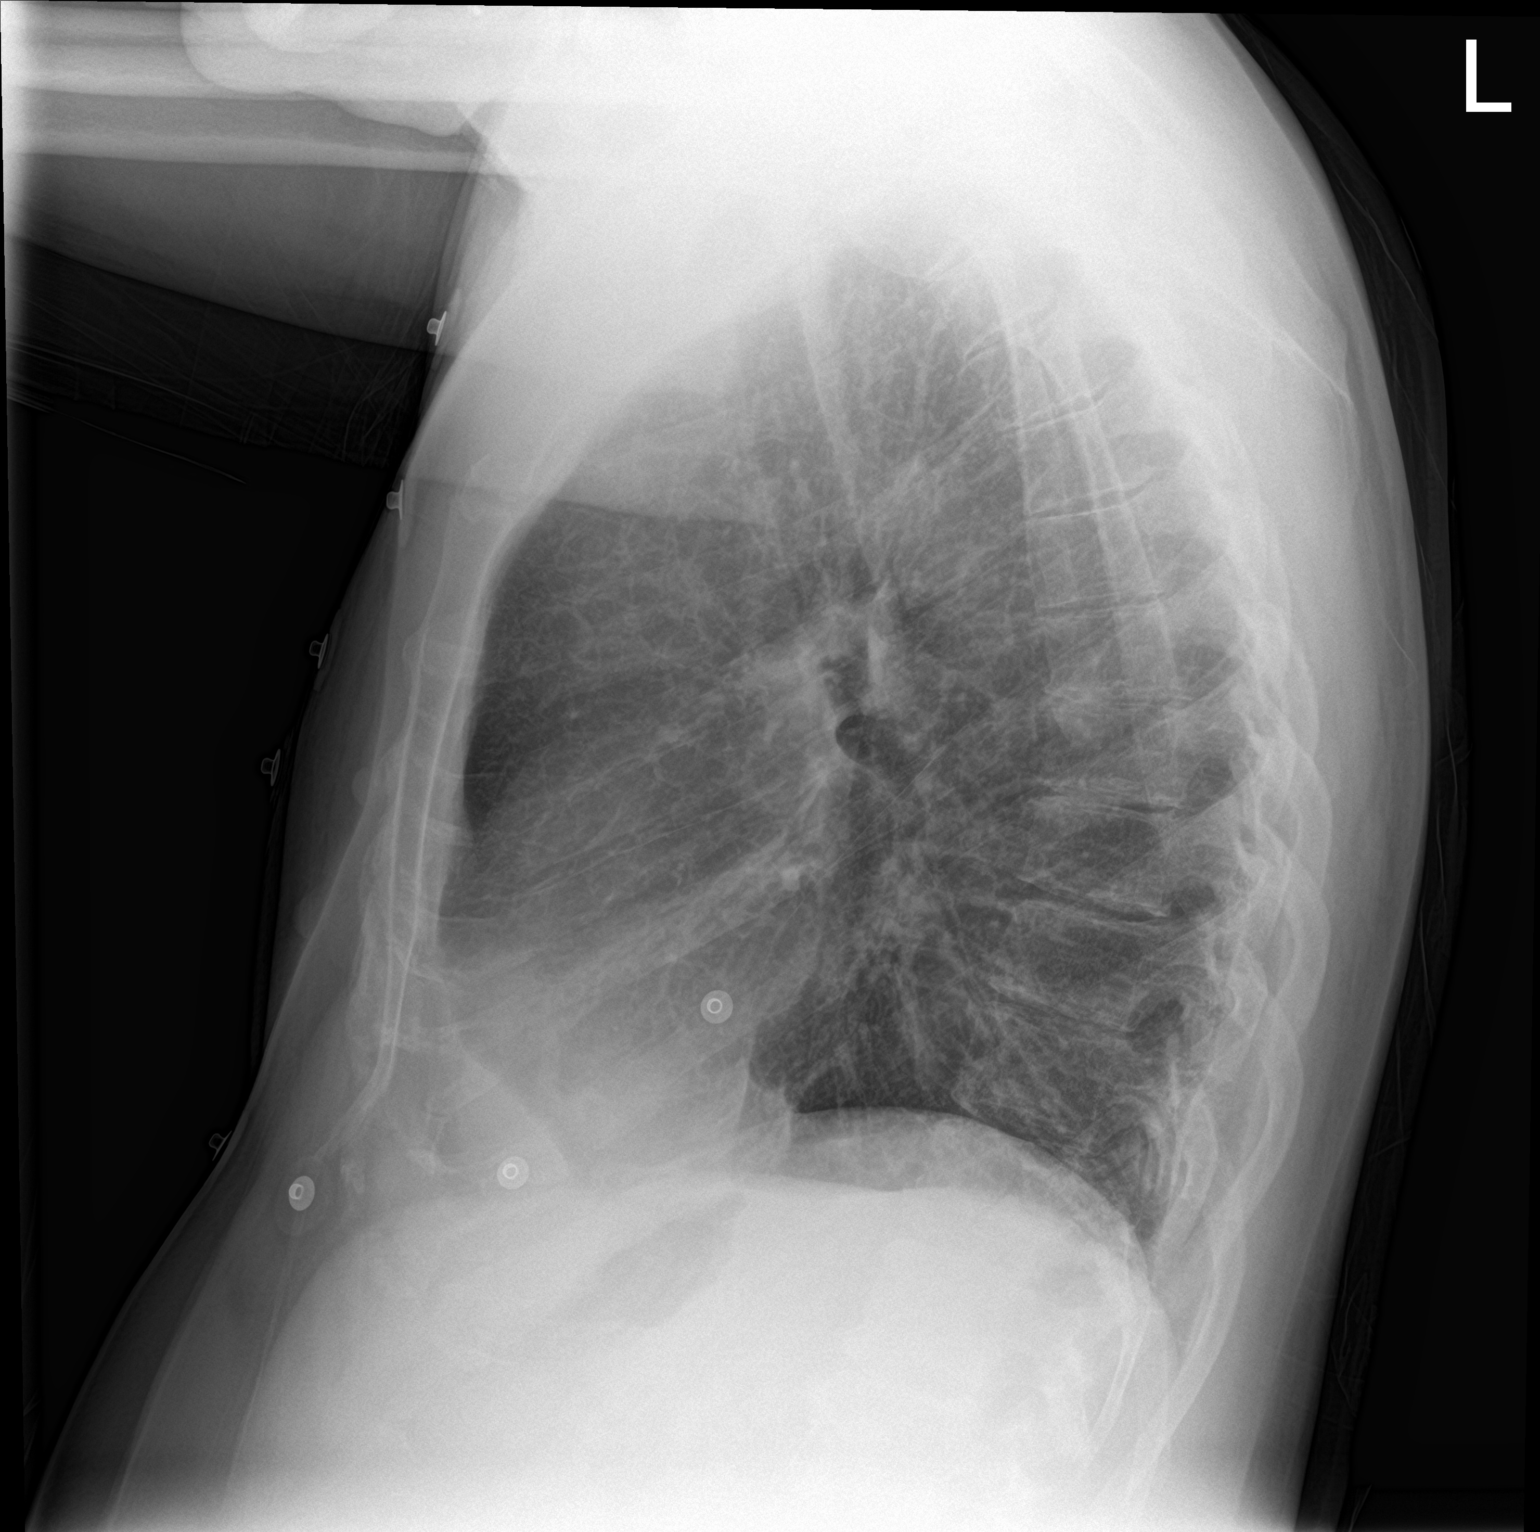

[2 of 2 positions shown; findings below may reference images not displayed]

FINDINGS: The heart size and mediastinal contours are within normal limits.
Both lungs are clear. The visualized skeletal structures are
unremarkable.
IMPRESSION: No active cardiopulmonary disease.
